# Patient Record
Sex: Male | Born: 1955 | Race: White | Hispanic: No | Marital: Married | Smoking: Current some day smoker
Health system: Southern US, Community
[De-identification: ages and names within clinical notes are randomized; demographics above are authoritative.]

## PROBLEM LIST (undated history)

## (undated) DIAGNOSIS — F329 Major depressive disorder, single episode, unspecified: Secondary | ICD-10-CM

## (undated) DIAGNOSIS — E785 Hyperlipidemia, unspecified: Secondary | ICD-10-CM

## (undated) DIAGNOSIS — I10 Essential (primary) hypertension: Secondary | ICD-10-CM

## (undated) DIAGNOSIS — F32A Depression, unspecified: Secondary | ICD-10-CM

## (undated) DIAGNOSIS — G252 Other specified forms of tremor: Secondary | ICD-10-CM

## (undated) HISTORY — DX: Hyperlipidemia, unspecified: E78.5

## (undated) HISTORY — DX: Depression, unspecified: F32.A

## (undated) HISTORY — DX: Essential (primary) hypertension: I10

## (undated) HISTORY — DX: Other specified forms of tremor: G25.2

---
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
## 2005-10-11 ENCOUNTER — Emergency Department (HOSPITAL_COMMUNITY): Admission: EM | Admit: 2005-10-11 | Discharge: 2005-10-11 | Payer: Self-pay | Admitting: Emergency Medicine

## 2005-12-16 ENCOUNTER — Emergency Department (HOSPITAL_COMMUNITY): Admission: EM | Admit: 2005-12-16 | Discharge: 2005-12-16 | Payer: Self-pay | Admitting: Emergency Medicine

## 2008-06-21 ENCOUNTER — Emergency Department (HOSPITAL_COMMUNITY): Admission: EM | Admit: 2008-06-21 | Discharge: 2008-06-22 | Payer: Self-pay | Admitting: Emergency Medicine

## 2009-06-10 HISTORY — PX: SHOULDER SURGERY: SHX246

## 2010-04-19 LAB — POCT I-STAT, CHEM 8
Glucose, Bld: 126 mg/dL — ABNORMAL HIGH (ref 70–99)
HCT: 45 % (ref 39.0–52.0)
Hemoglobin: 15.3 g/dL (ref 13.0–17.0)
Potassium: 4.3 mEq/L (ref 3.5–5.1)
Sodium: 141 mEq/L (ref 135–145)

## 2010-04-19 LAB — URINALYSIS, ROUTINE W REFLEX MICROSCOPIC
Bilirubin Urine: NEGATIVE
Nitrite: NEGATIVE
Specific Gravity, Urine: 1.019 (ref 1.005–1.030)
Urobilinogen, UA: 0.2 mg/dL (ref 0.0–1.0)

## 2010-04-19 LAB — CBC
MCHC: 35.5 g/dL (ref 30.0–36.0)
RDW: 12.7 % (ref 11.5–15.5)

## 2015-02-26 DIAGNOSIS — M5410 Radiculopathy, site unspecified: Secondary | ICD-10-CM | POA: Insufficient documentation

## 2015-02-26 DIAGNOSIS — M5412 Radiculopathy, cervical region: Secondary | ICD-10-CM | POA: Insufficient documentation

## 2015-02-26 DIAGNOSIS — R251 Tremor, unspecified: Secondary | ICD-10-CM | POA: Insufficient documentation

## 2015-02-26 DIAGNOSIS — E785 Hyperlipidemia, unspecified: Secondary | ICD-10-CM | POA: Insufficient documentation

## 2015-02-26 DIAGNOSIS — R7301 Impaired fasting glucose: Secondary | ICD-10-CM | POA: Insufficient documentation

## 2019-01-30 DIAGNOSIS — F321 Major depressive disorder, single episode, moderate: Secondary | ICD-10-CM | POA: Insufficient documentation

## 2019-05-13 ENCOUNTER — Ambulatory Visit: Payer: 59 | Admitting: Family Medicine

## 2019-05-13 ENCOUNTER — Other Ambulatory Visit: Payer: Self-pay

## 2019-05-13 ENCOUNTER — Encounter: Payer: Self-pay | Admitting: Family Medicine

## 2019-05-13 VITALS — BP 156/90 | HR 67 | Temp 98.2°F | Ht 69.0 in | Wt 202.2 lb

## 2019-05-13 DIAGNOSIS — Z8639 Personal history of other endocrine, nutritional and metabolic disease: Secondary | ICD-10-CM

## 2019-05-13 DIAGNOSIS — Z720 Tobacco use: Secondary | ICD-10-CM | POA: Diagnosis not present

## 2019-05-13 DIAGNOSIS — Z23 Encounter for immunization: Secondary | ICD-10-CM

## 2019-05-13 DIAGNOSIS — F418 Other specified anxiety disorders: Secondary | ICD-10-CM | POA: Diagnosis not present

## 2019-05-13 DIAGNOSIS — Z1211 Encounter for screening for malignant neoplasm of colon: Secondary | ICD-10-CM | POA: Insufficient documentation

## 2019-05-13 DIAGNOSIS — Z0000 Encounter for general adult medical examination without abnormal findings: Secondary | ICD-10-CM

## 2019-05-13 DIAGNOSIS — R0989 Other specified symptoms and signs involving the circulatory and respiratory systems: Secondary | ICD-10-CM

## 2019-05-13 DIAGNOSIS — R030 Elevated blood-pressure reading, without diagnosis of hypertension: Secondary | ICD-10-CM | POA: Diagnosis not present

## 2019-05-13 MED ORDER — ESCITALOPRAM OXALATE 20 MG PO TABS: 20.0000 mg | ORAL_TABLET | Freq: Every day | ORAL | 0 refills | Status: DC

## 2019-05-13 NOTE — Patient Instructions (Signed)
Coping with Quitting Smoking  Quitting smoking is a physical and mental challenge. You will face cravings, withdrawal symptoms, and temptation. Before quitting, work with your health care provider to make a plan that can help you cope. Preparation can help you quit and keep you from giving in. How can I cope with cravings? Cravings usually last for 5-10 minutes. If you get through it, the craving will pass. Consider taking the following actions to help you cope with cravings:  Keep your mouth busy: ? Chew sugar-free gum. ? Suck on hard candies or a straw. ? Brush your teeth.  Keep your hands and body busy: ? Immediately change to a different activity when you feel a craving. ? Squeeze or play with a ball. ? Do an activity or a hobby, like making bead jewelry, practicing needlepoint, or working with wood. ? Mix up your normal routine. ? Take a short exercise break. Go for a quick walk or run up and down stairs. ? Spend time in public places where smoking is not allowed.  Focus on doing something kind or helpful for someone else.  Call a friend or family member to talk during a craving.  Join a support group.  Call a quit line, such as 1-800-QUIT-NOW.  Talk with your health care provider about medicines that might help you cope with cravings and make quitting easier for you. How can I deal with withdrawal symptoms? Your body may experience negative effects as it tries to get used to not having nicotine in the system. These effects are called withdrawal symptoms. They may include:  Feeling hungrier than normal.  Trouble concentrating.  Irritability.  Trouble sleeping.  Feeling depressed.  Restlessness and agitation.  Craving a cigarette. To manage withdrawal symptoms:  Avoid places, people, and activities that trigger your cravings.  Remember why you want to quit.  Get plenty of sleep.  Avoid coffee and other caffeinated drinks. These may worsen some of your  symptoms. How can I handle social situations? Social situations can be difficult when you are quitting smoking, especially in the first few weeks. To manage this, you can:  Avoid parties, bars, and other social situations where people might be smoking.  Avoid alcohol.  Leave right away if you have the urge to smoke.  Explain to your family and friends that you are quitting smoking. Ask for understanding and support.  Plan activities with friends or family where smoking is not an option. What are some ways I can cope with stress? Wanting to smoke may cause stress, and stress can make you want to smoke. Find ways to manage your stress. Relaxation techniques can help. For example:  Breathe slowly and deeply, in through your nose and out through your mouth.  Listen to soothing, relaxing music.  Talk with a family member or friend about your stress.  Light a candle.  Soak in a bath or take a shower.  Think about a peaceful place. What are some ways I can prevent weight gain? Be aware that many people gain weight after they quit smoking. However, not everyone does. To keep from gaining weight, have a plan in place before you quit and stick to the plan after you quit. Your plan should include:  Having healthy snacks. When you have a craving, it may help to: ? Eat plain popcorn, crunchy carrots, celery, or other cut vegetables. ? Chew sugar-free gum.  Changing how you eat: ? Eat small portion sizes at meals. ? Eat 4-6 small meals   throughout the day instead of 1-2 large meals a day. ? Be mindful when you eat. Do not watch television or do other things that might distract you as you eat.  Exercising regularly: ? Make time to exercise each day. If you do not have time for a long workout, do short bouts of exercise for 5-10 minutes several times a day. ? Do some form of strengthening exercise, like weight lifting, and some form of aerobic exercise, like running or swimming.  Drinking  plenty of water or other low-calorie or no-calorie drinks. Drink 6-8 glasses of water daily, or as much as instructed by your health care provider. Summary  Quitting smoking is a physical and mental challenge. You will face cravings, withdrawal symptoms, and temptation to smoke again. Preparation can help you as you go through these challenges.  You can cope with cravings by keeping your mouth busy (such as by chewing gum), keeping your body and hands busy, and making calls to family, friends, or a helpline for people who want to quit smoking.  You can cope with withdrawal symptoms by avoiding places where people smoke, avoiding drinks with caffeine, and getting plenty of rest.  Ask your health care provider about the different ways to prevent weight gain, avoid stress, and handle social situations. This information is not intended to replace advice given to you by your health care provider. Make sure you discuss any questions you have with your health care provider. Document Revised: 12/09/2016 Document Reviewed: 12/25/2015 Elsevier Patient Education  2020 Elsevier Inc.  

## 2019-05-13 NOTE — Progress Notes (Signed)
New Patient Office Visit  Subjective:  Patient ID: Wayne Wheeler, male    DOB: [birth year]  Age: 64 y.o. MRN: [id number]  CC:  Chief Complaint  Patient presents with  . Establish Care    New patient, CPE no concerns.     HPI Wayne Wheeler presents for establishment of care and follow-up of some problems that he has been having.  Started on Lexapro 10 mg daily back in January.  He feels some better with it but feels as though he should be doing better.  He is having no issues taking the medicine.  Still dealing with some depression.  He retired back in the fall.  Has not been staying particularly busy with the cold weather.  Lives at home with his wife.  Tells that his legs get tired whenever he is walks for any length of time.  Smokes 1 pack of cigarettes daily.  History of borderline diabetes in the past and had taken Metformin.  Past Medical History:  Diagnosis Date  . Depression     Past Surgical History:  Procedure Laterality Date  . SHOULDER SURGERY Right 06/10/2009    Family History  Problem Relation Age of Onset  . Depression Mother     Social History   Socioeconomic History  . Marital status: Unknown    Spouse name: Not on file  . Number of children: Not on file  . Years of education: Not on file  . Highest education level: Not on file  Occupational History  . Not on file  Tobacco Use  . Smoking status: Current Some Day Smoker    Packs/day: 1.00  . Smokeless tobacco: Never Used  Substance and Sexual Activity  . Alcohol use: Yes    Alcohol/week: 1.0 standard drinks    Types: 1 Cans of beer per week    Comment: social  . Drug use: Never  . Sexual activity: Not on file  Other Topics Concern  . Not on file  Social History Narrative  . Not on file   Social Determinants of Health   Financial Resource Strain:   . Difficulty of Paying Living Expenses:   Food Insecurity:   . Worried About Charity fundraiser in the Last Year:   . Arboriculturist in the Last  Year:   Transportation Needs:   . Film/video editor (Medical):   Marland Kitchen Lack of Transportation (Non-Medical):   Physical Activity:   . Days of Exercise per Week:   . Minutes of Exercise per Session:   Stress:   . Feeling of Stress :   Social Connections:   . Frequency of Communication with Friends and Family:   . Frequency of Social Gatherings with Friends and Family:   . Attends Religious Services:   . Active Member of Clubs or Organizations:   . Attends Archivist Meetings:   Marland Kitchen Marital Status:   Intimate Partner Violence:   . Fear of Current or Ex-Partner:   . Emotionally Abused:   Marland Kitchen Physically Abused:   . Sexually Abused:     ROS Review of Systems  Constitutional: Negative.   HENT: Negative.   Eyes: Negative for photophobia and visual disturbance.  Respiratory: Negative for chest tightness and shortness of breath.   Cardiovascular: Negative for chest pain.  Gastrointestinal: Negative.   Endocrine: Negative for polyphagia and polyuria.  Genitourinary: Negative.   Musculoskeletal: Positive for gait problem and myalgias.  Skin: Negative for pallor and rash.  Allergic/Immunologic: Negative  for immunocompromised state.  Neurological: Negative for speech difficulty and light-headedness.  Hematological: Does not bruise/bleed easily.   Depression screen [facility] 2/9 05/13/2019 05/13/2019  Decreased Interest 2 2  Down, Depressed, Hopeless 2 0  PHQ - 2 Score 4 2  Altered sleeping 1 -  Tired, decreased energy 3 -  Change in appetite 1 -  Feeling bad or failure about yourself  1 -  Trouble concentrating 1 -  Moving slowly or fidgety/restless 1 -  Suicidal thoughts 0 -  PHQ-9 Score 12 -  Difficult doing work/chores Very difficult -    Objective:   Today's Vitals: BP (!) 156/90   Pulse 67   Temp 98.2 F (36.8 C) (Tympanic)   Ht 5\' 9"  (1.753 m)   Wt 202 lb 3.2 oz (91.7 kg)   SpO2 96%   BMI 29.86 kg/m   Physical Exam Constitutional:      General: He is not in  acute distress.    Appearance: Normal appearance. He is not ill-appearing or toxic-appearing.  HENT:     Head: Normocephalic and atraumatic.     Right Ear: External ear normal.     Left Ear: External ear normal.  Eyes:     General: No scleral icterus.       Right eye: No discharge.        Left eye: No discharge.     Conjunctiva/sclera: Conjunctivae normal.  Cardiovascular:     Rate and Rhythm: Normal rate and regular rhythm.     Pulses:          Dorsalis pedis pulses are 0 on the right side and 2+ on the left side.       Posterior tibial pulses are 0 on the right side and 0 on the left side.  Pulmonary:     Effort: Pulmonary effort is normal.     Breath sounds: Decreased breath sounds present.  Abdominal:     General: Bowel sounds are normal.  Musculoskeletal:     Cervical back: No rigidity or tenderness.  Lymphadenopathy:     Cervical: No cervical adenopathy.  Skin:    General: Skin is warm and dry.  Neurological:     Mental Status: He is alert and oriented to person, place, and time.  Psychiatric:        Mood and Affect: Mood normal.        Behavior: Behavior normal.    Diabetic Foot Exam - Simple   Simple Foot Form Visual Inspection No deformities, no ulcerations, no other skin breakdown bilaterally: Yes Sensation Testing Intact to touch and monofilament testing bilaterally: Yes Pulse Check See comments: Yes Comments Diminished with delayed capillary refill.      Assessment & Plan:   Problem List Items Addressed This Visit      Other   Tobacco use   Decreased pedal pulses   Relevant Orders   VAS ABI WITH/WO TBI   LDL cholesterol, direct   Lipid panel   Need for Tdap vaccination - Primary   Depression with anxiety   Relevant Medications   escitalopram (LEXAPRO) 20 MG tablet   Elevated BP without diagnosis of hypertension   Relevant Orders   Comprehensive metabolic panel   Urinalysis, Routine w reflex microscopic   CBC   History of diabetes  mellitus, type II   Relevant Orders   Hemoglobin A1c   Healthcare maintenance   Relevant Orders   Comprehensive metabolic panel   LDL cholesterol, direct   PSA  Outpatient Encounter Medications as of 05/13/2019  Medication Sig  . aspirin 81 MG EC tablet Take by mouth.  Marland Kitchen ibuprofen (ADVIL) 100 MG/5ML suspension Take 200 mg by mouth every 4 (four) hours as needed.  . [DISCONTINUED] escitalopram (LEXAPRO) 10 MG tablet Take by mouth.  . escitalopram (LEXAPRO) 20 MG tablet Take 1 tablet (20 mg total) by mouth daily.   No facility-administered encounter medications on file as of 05/13/2019.    Follow-up: Return in about 1 month (around 06/13/2019).   Increase Lexapro to 20 mg daily.  Given information on coping with quitting smoking and advised to do so.  Have increased the Lexapro to 20 mg daily.  We will send for ABIs.  Follow-up in one 1 month.  Will need physical exam with labs in the near future. Labs have been ordered for future.   Mliss Sax, MD

## 2019-05-23 ENCOUNTER — Other Ambulatory Visit: Payer: Self-pay

## 2019-05-23 ENCOUNTER — Ambulatory Visit (HOSPITAL_BASED_OUTPATIENT_CLINIC_OR_DEPARTMENT_OTHER)
Admission: RE | Admit: 2019-05-23 | Discharge: 2019-05-23 | Disposition: A | Discharge status: Home / Self Care | Payer: 59 | Source: Ambulatory Visit | Attending: Family Medicine | Admitting: Family Medicine

## 2019-05-23 DIAGNOSIS — R0989 Other specified symptoms and signs involving the circulatory and respiratory systems: Secondary | ICD-10-CM | POA: Diagnosis not present

## 2019-05-23 NOTE — Progress Notes (Signed)
  Echocardiogram 2D Echocardiogram has been performed.  Wayne Wheeler 05/23/2019, 2:56 PM

## 2019-05-27 ENCOUNTER — Telehealth: Payer: Self-pay | Admitting: Family Medicine

## 2019-05-27 NOTE — Telephone Encounter (Signed)
Patient is returning the call. CB is [phone].

## 2019-05-29 NOTE — Telephone Encounter (Signed)
Spoke with patient.

## 2019-06-12 ENCOUNTER — Other Ambulatory Visit: Payer: Self-pay

## 2019-06-13 ENCOUNTER — Encounter: Payer: Self-pay | Admitting: Family Medicine

## 2019-06-13 ENCOUNTER — Ambulatory Visit: Payer: 59 | Admitting: Family Medicine

## 2019-06-13 VITALS — BP 154/78 | HR 65 | Temp 98.4°F | Ht 69.0 in | Wt 202.4 lb

## 2019-06-13 DIAGNOSIS — F418 Other specified anxiety disorders: Secondary | ICD-10-CM | POA: Diagnosis not present

## 2019-06-13 DIAGNOSIS — Z720 Tobacco use: Secondary | ICD-10-CM | POA: Diagnosis not present

## 2019-06-13 DIAGNOSIS — R030 Elevated blood-pressure reading, without diagnosis of hypertension: Secondary | ICD-10-CM

## 2019-06-13 MED ORDER — ESCITALOPRAM OXALATE 20 MG PO TABS: 20.0000 mg | ORAL_TABLET | Freq: Every day | ORAL | 0 refills | Status: DC

## 2019-06-13 NOTE — Patient Instructions (Signed)
Coping with Quitting Smoking  Quitting smoking is a physical and mental challenge. You will face cravings, withdrawal symptoms, and temptation. Before quitting, work with your health care provider to make a plan that can help you cope. Preparation can help you quit and keep you from giving in. How can I cope with cravings? Cravings usually last for 5-10 minutes. If you get through it, the craving will pass. Consider taking the following actions to help you cope with cravings:  Keep your mouth busy: ? Chew sugar-free gum. ? Suck on hard candies or a straw. ? Brush your teeth.  Keep your hands and body busy: ? Immediately change to a different activity when you feel a craving. ? Squeeze or play with a ball. ? Do an activity or a hobby, like making bead jewelry, practicing needlepoint, or working with wood. ? Mix up your normal routine. ? Take a short exercise break. Go for a quick walk or run up and down stairs. ? Spend time in public places where smoking is not allowed.  Focus on doing something kind or helpful for someone else.  Call a friend or family member to talk during a craving.  Join a support group.  Call a quit line, such as 1-800-QUIT-NOW.  Talk with your health care provider about medicines that might help you cope with cravings and make quitting easier for you. How can I deal with withdrawal symptoms? Your body may experience negative effects as it tries to get used to not having nicotine in the system. These effects are called withdrawal symptoms. They may include:  Feeling hungrier than normal.  Trouble concentrating.  Irritability.  Trouble sleeping.  Feeling depressed.  Restlessness and agitation.  Craving a cigarette. To manage withdrawal symptoms:  Avoid places, people, and activities that trigger your cravings.  Remember why you want to quit.  Get plenty of sleep.  Avoid coffee and other caffeinated drinks. These may worsen some of your  symptoms. How can I handle social situations? Social situations can be difficult when you are quitting smoking, especially in the first few weeks. To manage this, you can:  Avoid parties, bars, and other social situations where people might be smoking.  Avoid alcohol.  Leave right away if you have the urge to smoke.  Explain to your family and friends that you are quitting smoking. Ask for understanding and support.  Plan activities with friends or family where smoking is not an option. What are some ways I can cope with stress? Wanting to smoke may cause stress, and stress can make you want to smoke. Find ways to manage your stress. Relaxation techniques can help. For example:  Breathe slowly and deeply, in through your nose and out through your mouth.  Listen to soothing, relaxing music.  Talk with a family member or friend about your stress.  Light a candle.  Soak in a bath or take a shower.  Think about a peaceful place. What are some ways I can prevent weight gain? Be aware that many people gain weight after they quit smoking. However, not everyone does. To keep from gaining weight, have a plan in place before you quit and stick to the plan after you quit. Your plan should include:  Having healthy snacks. When you have a craving, it may help to: ? Eat plain popcorn, crunchy carrots, celery, or other cut vegetables. ? Chew sugar-free gum.  Changing how you eat: ? Eat small portion sizes at meals. ? Eat 4-6 small meals   throughout the day instead of 1-2 large meals a day. ? Be mindful when you eat. Do not watch television or do other things that might distract you as you eat.  Exercising regularly: ? Make time to exercise each day. If you do not have time for a long workout, do short bouts of exercise for 5-10 minutes several times a day. ? Do some form of strengthening exercise, like weight lifting, and some form of aerobic exercise, like running or swimming.  Drinking  plenty of water or other low-calorie or no-calorie drinks. Drink 6-8 glasses of water daily, or as much as instructed by your health care provider. Summary  Quitting smoking is a physical and mental challenge. You will face cravings, withdrawal symptoms, and temptation to smoke again. Preparation can help you as you go through these challenges.  You can cope with cravings by keeping your mouth busy (such as by chewing gum), keeping your body and hands busy, and making calls to family, friends, or a helpline for people who want to quit smoking.  You can cope with withdrawal symptoms by avoiding places where people smoke, avoiding drinks with caffeine, and getting plenty of rest.  Ask your health care provider about the different ways to prevent weight gain, avoid stress, and handle social situations. This information is not intended to replace advice given to you by your health care provider. Make sure you discuss any questions you have with your health care provider. Document Revised: 12/09/2016 Document Reviewed: 12/25/2015 Elsevier Patient Education  2020 Elsevier Inc.  Preventing Hypertension Hypertension, commonly called high blood pressure, is when the force of blood pumping through the arteries is too strong. Arteries are blood vessels that carry blood from the heart throughout the body. Over time, hypertension can damage the arteries and decrease blood flow to important parts of the body, including the brain, heart, and kidneys. Often, hypertension does not cause symptoms until blood pressure is very high. For this reason, it is important to have your blood pressure checked on a regular basis. Hypertension can often be prevented with diet and lifestyle changes. If you already have hypertension, you can control it with diet and lifestyle changes, as well as medicine. What nutrition changes can be made? Maintain a healthy diet. This includes:  Eating less salt (sodium). Ask your health care  provider how much sodium is safe for you to have. The general recommendation is to consume less than 1 tsp (2,300 mg) of sodium a day. ? Do not add salt to your food. ? Choose low-sodium options when grocery shopping and eating out.  Limiting fats in your diet. You can do this by eating low-fat or fat-free dairy products and by eating less red meat.  Eating more fruits, vegetables, and whole grains. Make a goal to eat: ? 1-2 cups of fresh fruits and vegetables each day. ? 3-4 servings of whole grains each day.  Avoiding foods and beverages that have added sugars.  Eating fish that contain healthy fats (omega-3 fatty acids), such as mackerel or salmon. If you need help putting together a healthy eating plan, try the DASH diet. This diet is high in fruits, vegetables, and whole grains. It is low in sodium, red meat, and added sugars. DASH stands for Dietary Approaches to Stop Hypertension. What lifestyle changes can be made?   Lose weight if you are overweight. Losing just 3?5% of your body weight can help prevent or control hypertension. ? For example, if your present weight is 200   lb (91 kg), a loss of 3-5% of your weight means losing 6-10 lb (2.7-4.5 kg). ? Ask your health care provider to help you with a diet and exercise plan to safely lose weight.  Get enough exercise. Do at least 150 minutes of moderate-intensity exercise each week. ? You could do this in short exercise sessions several times a day, or you could do longer exercise sessions a few times a week. For example, you could take a brisk 10-minute walk or bike ride, 3 times a day, for 5 days a week.  Find ways to reduce stress, such as exercising, meditating, listening to music, or taking a yoga class. If you need help reducing stress, ask your health care provider.  Do not smoke. This includes e-cigarettes. Chemicals in tobacco and nicotine products raise your blood pressure each time you smoke. If you need help quitting, ask  your health care provider.  Avoid alcohol. If you drink alcohol, limit alcohol intake to no more than 1 drink a day for nonpregnant women and 2 drinks a day for men. One drink equals 12 oz of beer, 5 oz of wine, or 1 oz of hard liquor. Why are these changes important? Diet and lifestyle changes can help you prevent hypertension, and they may make you feel better overall and improve your quality of life. If you have hypertension, making these changes will help you control it and help prevent major complications, such as:  Hardening and narrowing of arteries that supply blood to: ? Your heart. This can cause a heart attack. ? Your brain. This can cause a stroke. ? Your kidneys. This can cause kidney failure.  Stress on your heart muscle, which can cause heart failure. What can I do to lower my risk?  Work with your health care provider to make a hypertension prevention plan that works for you. Follow your plan and keep all follow-up visits as told by your health care provider.  Learn how to check your blood pressure at home. Make sure that you know your personal target blood pressure, as told by your health care provider. How is this treated? In addition to diet and lifestyle changes, your health care provider may recommend medicines to help lower your blood pressure. You may need to try a few different medicines to find what works best for you. You also may need to take more than one medicine. Take over-the-counter and prescription medicines only as told by your health care provider. Where to find support Your health care provider can help you prevent hypertension and help you keep your blood pressure at a healthy level. Your local hospital or your community may also provide support services and prevention programs. The American Heart Association offers an online support network at: http://supportnetwork.heart.org/high-blood-pressure Where to find more information Learn more about hypertension  from:  National Heart, Lung, and Blood Institute: www.nhlbi.nih.gov/health/health-topics/topics/hbp  Centers for Disease Control and Prevention: www.cdc.gov/bloodpressure  American Academy of Family Physicians: http://familydoctor.org/familydoctor/en/diseases-conditions/high-blood-pressure.printerview.all.html Learn more about the DASH diet from:  National Heart, Lung, and Blood Institute: www.nhlbi.nih.gov/health/health-topics/topics/dash Contact a health care provider if:  You think you are having a reaction to medicines you have taken.  You have recurrent headaches or feel dizzy.  You have swelling in your ankles.  You have trouble with your vision. Summary  Hypertension often does not cause any symptoms until blood pressure is very high. It is important to get your blood pressure checked regularly.  Diet and lifestyle changes are the most important steps in preventing   hypertension.  By keeping your blood pressure in a healthy range, you can prevent complications like heart attack, heart failure, stroke, and kidney failure.  Work with your health care provider to make a hypertension prevention plan that works for you. This information is not intended to replace advice given to you by your health care provider. Make sure you discuss any questions you have with your health care provider. Document Revised: 04/20/2018 Document Reviewed: 09/07/2015 Elsevier Patient Education  2020 Elsevier Inc.  

## 2019-06-13 NOTE — Progress Notes (Signed)
Established Patient Office Visit  Subjective:  Patient ID: Wayne Wheeler, male    DOB: 1955-12-25  Age: 64 y.o. MRN: [id number]  CC:  Chief Complaint  Patient presents with  . Follow-up    1 month follow up on meds and depression/anxiety.     HPI Wayne Wheeler presents for follow-up of depression with anxiety status post increasing Lexapro to 20 mg.  Fortunately he has noted on marketed improvement.  Feeling much better.  Less anxiety.  Feels more like getting out of the house and going out about.  On edge a little bit because his sister may have an aggressive tumor in her chest.  Has blood pressure cuff at home.  Past Medical History:  Diagnosis Date  . Depression     Past Surgical History:  Procedure Laterality Date  . SHOULDER SURGERY Right 06/10/2009    Family History  Problem Relation Age of Onset  . Depression Mother     Social History   Socioeconomic History  . Marital status: Unknown    Spouse name: Not on file  . Number of children: Not on file  . Years of education: Not on file  . Highest education level: Not on file  Occupational History  . Not on file  Tobacco Use  . Smoking status: Current Some Day Smoker    Packs/day: 1.00  . Smokeless tobacco: Never Used  Substance and Sexual Activity  . Alcohol use: Yes    Alcohol/week: 1.0 standard drinks    Types: 1 Cans of beer per week    Comment: social  . Drug use: Never  . Sexual activity: Not on file  Other Topics Concern  . Not on file  Social History Narrative  . Not on file   Social Determinants of Health   Financial Resource Strain:   . Difficulty of Paying Living Expenses:   Food Insecurity:   . Worried About Programme researcher, broadcasting/film/video in the Last Year:   . Barista in the Last Year:   Transportation Needs:   . Freight forwarder (Medical):   Marland Kitchen Lack of Transportation (Non-Medical):   Physical Activity:   . Days of Exercise per Week:   . Minutes of Exercise per Session:   Stress:   .  Feeling of Stress :   Social Connections:   . Frequency of Communication with Friends and Family:   . Frequency of Social Gatherings with Friends and Family:   . Attends Religious Services:   . Active Member of Clubs or Organizations:   . Attends Banker Meetings:   Marland Kitchen Marital Status:   Intimate Partner Violence:   . Fear of Current or Ex-Partner:   . Emotionally Abused:   Marland Kitchen Physically Abused:   . Sexually Abused:     Outpatient Medications Prior to Visit  Medication Sig Dispense Refill  . aspirin 81 MG EC tablet Take by mouth.    Marland Kitchen ibuprofen (ADVIL) 100 MG/5ML suspension Take 200 mg by mouth every 4 (four) hours as needed.    Marland Kitchen escitalopram (LEXAPRO) 20 MG tablet Take 1 tablet (20 mg total) by mouth daily. 90 tablet 0   No facility-administered medications prior to visit.    Not on File  ROS Review of Systems  HENT: Negative.   Respiratory: Negative.   Cardiovascular: Negative.   Gastrointestinal: Negative.   Neurological: Negative for light-headedness and headaches.   Depression screen [facility] 2/9 06/13/2019 05/13/2019 05/13/2019  Decreased Interest 1 2 2  Down, Depressed, Hopeless 1 2 0  PHQ - 2 Score 2 4 2   Altered sleeping 1 1 -  Tired, decreased energy 1 3 -  Change in appetite 1 1 -  Feeling bad or failure about yourself  1 1 -  Trouble concentrating 1 1 -  Moving slowly or fidgety/restless 1 1 -  Suicidal thoughts 0 0 -  PHQ-9 Score 8 12 -  Difficult doing work/chores Somewhat difficult Very difficult -      Objective:    Physical Exam  Constitutional: He is oriented to person, place, and time. He appears well-developed and well-nourished. No distress.  HENT:  Head: Normocephalic and atraumatic.  Right Ear: External ear normal.  Left Ear: External ear normal.  Eyes: Conjunctivae are normal. Right eye exhibits no discharge. Left eye exhibits no discharge. No scleral icterus.  Neck: No JVD present. No tracheal deviation present.  Pulmonary/Chest:  Effort normal. No stridor.  Neurological: He is alert and oriented to person, place, and time.  Skin: He is not diaphoretic.  Psychiatric: He has a normal mood and affect. His behavior is normal.    BP (!) 154/78   Pulse 65   Temp 98.4 F (36.9 C) (Tympanic)   Ht 5\' 9"  (1.753 m)   Wt 202 lb 6.4 oz (91.8 kg)   SpO2 96%   BMI 29.89 kg/m  Wt Readings from Last 3 Encounters:  06/13/19 202 lb 6.4 oz (91.8 kg)  05/13/19 202 lb 3.2 oz (91.7 kg)     Health Maintenance Due  Topic Date Due  . Hepatitis C Screening  Never done  . COVID-19 Vaccine (1) Never done  . HIV Screening  Never done  . TETANUS/TDAP  Never done    There are no preventive care reminders to display for this patient.  No results found for: TSH Lab Results  Component Value Date   WBC 9.6 06/21/2008   HGB 15.3 06/22/2008   HCT 45.0 06/22/2008   MCV 93.7 06/21/2008   PLT 169 06/21/2008   Lab Results  Component Value Date   NA 141 06/22/2008   K 4.3 06/22/2008   GLUCOSE 126 (H) 06/22/2008   BUN 11 06/22/2008   CREATININE 0.8 06/22/2008   No results found for: CHOL No results found for: HDL No results found for: LDLCALC No results found for: TRIG No results found for: CHOLHDL No results found for: HGBA1C    Assessment & Plan:   Problem List Items Addressed This Visit      Other   Tobacco use   Depression with anxiety - Primary   Relevant Medications   escitalopram (LEXAPRO) 20 MG tablet   Elevated BP without diagnosis of hypertension      Meds ordered this encounter  Medications  . escitalopram (LEXAPRO) 20 MG tablet    Sig: Take 1 tablet (20 mg total) by mouth daily.    Dispense:  90 tablet    Refill:  0    Follow-up: Return in about 2 months (around 08/13/2019), or exercise, check and record blood pressure, try to stop smoking..  Will return in a few months for his physical and ordered blood work.  Given information on steps to quit smoking and preventing hypertension.  He knows to  avoid salt.  Will check and record his blood pressures.  Libby Maw, MD

## 2019-09-24 ENCOUNTER — Encounter: Payer: Self-pay | Admitting: Family Medicine

## 2019-09-24 ENCOUNTER — Other Ambulatory Visit: Payer: Self-pay

## 2019-09-25 ENCOUNTER — Ambulatory Visit (INDEPENDENT_AMBULATORY_CARE_PROVIDER_SITE_OTHER): Payer: 59 | Admitting: Family Medicine

## 2019-09-25 ENCOUNTER — Encounter: Payer: Self-pay | Admitting: Family Medicine

## 2019-09-25 ENCOUNTER — Encounter: Payer: 59 | Admitting: Family Medicine

## 2019-09-25 VITALS — BP 156/78 | HR 62 | Temp 98.5°F | Ht 69.0 in | Wt 203.2 lb

## 2019-09-25 DIAGNOSIS — Z0000 Encounter for general adult medical examination without abnormal findings: Secondary | ICD-10-CM

## 2019-09-25 DIAGNOSIS — Z23 Encounter for immunization: Secondary | ICD-10-CM

## 2019-09-25 DIAGNOSIS — R0989 Other specified symptoms and signs involving the circulatory and respiratory systems: Secondary | ICD-10-CM

## 2019-09-25 DIAGNOSIS — Z720 Tobacco use: Secondary | ICD-10-CM

## 2019-09-25 DIAGNOSIS — Z125 Encounter for screening for malignant neoplasm of prostate: Secondary | ICD-10-CM | POA: Diagnosis not present

## 2019-09-25 DIAGNOSIS — R030 Elevated blood-pressure reading, without diagnosis of hypertension: Secondary | ICD-10-CM | POA: Diagnosis not present

## 2019-09-25 DIAGNOSIS — F418 Other specified anxiety disorders: Secondary | ICD-10-CM

## 2019-09-25 DIAGNOSIS — I10 Essential (primary) hypertension: Secondary | ICD-10-CM

## 2019-09-25 DIAGNOSIS — Z8639 Personal history of other endocrine, nutritional and metabolic disease: Secondary | ICD-10-CM | POA: Diagnosis not present

## 2019-09-25 DIAGNOSIS — E7800 Pure hypercholesterolemia, unspecified: Secondary | ICD-10-CM

## 2019-09-25 LAB — CBC
HCT: 46.8 % (ref 39.0–52.0)
Hemoglobin: 15.8 g/dL (ref 13.0–17.0)
MCHC: 33.9 g/dL (ref 30.0–36.0)
MCV: 98.2 fl (ref 78.0–100.0)
Platelets: 147 10*3/uL — ABNORMAL LOW (ref 150.0–400.0)
RBC: 4.76 Mil/uL (ref 4.22–5.81)
RDW: 13.7 % (ref 11.5–15.5)
WBC: 7 10*3/uL (ref 4.0–10.5)

## 2019-09-25 LAB — COMPREHENSIVE METABOLIC PANEL
ALT: 23 U/L (ref 0–53)
AST: 16 U/L (ref 0–37)
Albumin: 4.4 g/dL (ref 3.5–5.2)
Alkaline Phosphatase: 68 U/L (ref 39–117)
BUN: 11 mg/dL (ref 6–23)
CO2: 28 mEq/L (ref 19–32)
Calcium: 9.2 mg/dL (ref 8.4–10.5)
Chloride: 104 mEq/L (ref 96–112)
Creatinine, Ser: 0.99 mg/dL (ref 0.40–1.50)
GFR: 76.1 mL/min (ref >60.00)
Glucose, Bld: 123 mg/dL — ABNORMAL HIGH (ref 70–99)
Potassium: 4.1 mEq/L (ref 3.5–5.1)
Sodium: 138 mEq/L (ref 135–145)
Total Bilirubin: 0.4 mg/dL (ref 0.2–1.2)
Total Protein: 7 g/dL (ref 6.0–8.3)

## 2019-09-25 LAB — HEMOGLOBIN A1C: Hgb A1c MFr Bld: 6.1 % (ref 4.6–6.5)

## 2019-09-25 LAB — LIPID PANEL
Cholesterol: 186 mg/dL (ref 0–200)
HDL: 38.7 mg/dL — ABNORMAL LOW (ref >39.00)
LDL Cholesterol: 114 mg/dL — ABNORMAL HIGH (ref 0–99)
NonHDL: 147.59
Total CHOL/HDL Ratio: 5
Triglycerides: 167 mg/dL — ABNORMAL HIGH (ref 0.0–149.0)
VLDL: 33.4 mg/dL (ref 0.0–40.0)

## 2019-09-25 LAB — PSA: PSA: 0.64 ng/mL (ref 0.10–4.00)

## 2019-09-25 LAB — LDL CHOLESTEROL, DIRECT: Direct LDL: 128 mg/dL

## 2019-09-25 MED ORDER — LISINOPRIL 20 MG PO TABS: 20.0000 mg | ORAL_TABLET | Freq: Every day | ORAL | 0 refills | Status: DC

## 2019-09-25 NOTE — Progress Notes (Addendum)
Established Patient Office Visit  Subjective:  Patient ID: Wayne Wheeler, male    DOB: 05-18-1955  Age: 64 y.o. MRN: [id number]  CC:  Chief Complaint  Patient presents with  . Annual Exam    CPE, no concerns     HPI Wayne Wheeler presents for continues Lexapro for depression and anxiety and it is helping.  He is fasting today for complete physical.  He has taken some steps to quit smoking.  History of borderline diabetes.  Blood pressure has been borderline in the past but it continues to be elevated.  He denies headaches or lightheadedness.  Fortunately ABIs showed that blood flow to his feet is intact.  He has had a colonoscopy in the past.  He was advised to return in 10 years.  Denies any changes in his bowel habits.  Force of urinary stream is decreased but denies pre or post void dribble or nocturia.  It is manageable for him at this point.  Past Medical History:  Diagnosis Date  . Depression     Past Surgical History:  Procedure Laterality Date  . SHOULDER SURGERY Right 06/10/2009    Family History  Problem Relation Age of Onset  . Depression Mother     Social History   Socioeconomic History  . Marital status: Unknown    Spouse name: Not on file  . Number of children: Not on file  . Years of education: Not on file  . Highest education level: Not on file  Occupational History  . Not on file  Tobacco Use  . Smoking status: Current Some Day Smoker    Packs/day: 1.00  . Smokeless tobacco: Never Used  Vaping Use  . Vaping Use: Never used  Substance and Sexual Activity  . Alcohol use: Yes    Alcohol/week: 1.0 standard drink    Types: 1 Cans of beer per week    Comment: social  . Drug use: Never  . Sexual activity: Not on file  Other Topics Concern  . Not on file  Social History Narrative  . Not on file   Social Determinants of Health   Financial Resource Strain:   . Difficulty of Paying Living Expenses: Not on file  Food Insecurity:   . Worried About  Programme researcher, broadcasting/film/video in the Last Year: Not on file  . Ran Out of Food in the Last Year: Not on file  Transportation Needs:   . Lack of Transportation (Medical): Not on file  . Lack of Transportation (Non-Medical): Not on file  Physical Activity:   . Days of Exercise per Week: Not on file  . Minutes of Exercise per Session: Not on file  Stress:   . Feeling of Stress : Not on file  Social Connections:   . Frequency of Communication with Friends and Family: Not on file  . Frequency of Social Gatherings with Friends and Family: Not on file  . Attends Religious Services: Not on file  . Active Member of Clubs or Organizations: Not on file  . Attends Banker Meetings: Not on file  . Marital Status: Not on file  Intimate Partner Violence:   . Fear of Current or Ex-Partner: Not on file  . Emotionally Abused: Not on file  . Physically Abused: Not on file  . Sexually Abused: Not on file    Outpatient Medications Prior to Visit  Medication Sig Dispense Refill  . aspirin 81 MG EC tablet Take by mouth.    . escitalopram (  LEXAPRO) 20 MG tablet Take 1 tablet (20 mg total) by mouth daily. 90 tablet 0  . ibuprofen (ADVIL) 100 MG/5ML suspension Take 200 mg by mouth every 4 (four) hours as needed. (Patient not taking: Reported on 09/25/2019)     No facility-administered medications prior to visit.    Allergies  Allergen Reactions  . Prednisone Other (See Comments)    Dyspepsia    ROS Review of Systems  Constitutional: Negative.   HENT: Negative.   Eyes: Negative for photophobia and visual disturbance.  Respiratory: Negative.   Cardiovascular: Negative.   Gastrointestinal: Negative.   Endocrine: Negative for polyphagia and polyuria.  Genitourinary: Negative.   Musculoskeletal: Negative for gait problem and joint swelling.  Skin: Negative for pallor and rash.  Allergic/Immunologic: Negative for immunocompromised state.  Neurological: Negative for light-headedness and  numbness.  Hematological: Does not bruise/bleed easily.  Psychiatric/Behavioral: Negative.       Objective:    Physical Exam Vitals and nursing note reviewed.  Constitutional:      General: He is not in acute distress.    Appearance: Normal appearance. He is not ill-appearing, toxic-appearing or diaphoretic.  HENT:     Head: Normocephalic and atraumatic.     Right Ear: External ear normal.     Left Ear: External ear normal.  Eyes:     General: No scleral icterus.       Right eye: No discharge.        Left eye: No discharge.     Extraocular Movements: Extraocular movements intact.     Conjunctiva/sclera: Conjunctivae normal.     Pupils: Pupils are equal, round, and reactive to light.  Neck:     Vascular: No carotid bruit.  Cardiovascular:     Rate and Rhythm: Normal rate and regular rhythm.  Pulmonary:     Effort: Pulmonary effort is normal.     Breath sounds: Normal breath sounds.  Abdominal:     General: Abdomen is flat. Bowel sounds are normal. There is no distension.     Palpations: Abdomen is soft. There is no mass.     Tenderness: There is no abdominal tenderness. There is no guarding or rebound.     Hernia: No hernia is present.  Genitourinary:    Prostate: Enlarged. Not tender and no nodules present.     Rectum: Guaiac result negative. External hemorrhoid present. No mass, tenderness, anal fissure or internal hemorrhoid. Normal anal tone.  Musculoskeletal:     Cervical back: No rigidity or tenderness.     Right lower leg: No edema.     Left lower leg: No edema.  Lymphadenopathy:     Cervical: No cervical adenopathy.  Skin:    General: Skin is warm.  Neurological:     General: No focal deficit present.     Mental Status: He is alert and oriented to person, place, and time.  Psychiatric:        Mood and Affect: Mood normal.        Behavior: Behavior normal.     There were no vitals taken for this visit. Wt Readings from Last 3 Encounters:  09/25/19 203  lb 3.2 oz (92.2 kg)  06/13/19 202 lb 6.4 oz (91.8 kg)  05/13/19 202 lb 3.2 oz (91.7 kg)     Health Maintenance Due  Topic Date Due  . Hepatitis C Screening  Never done  . HIV Screening  Never done    There are no preventive care reminders to display for this  patient.  No results found for: TSH Lab Results  Component Value Date   WBC 7.0 09/25/2019   HGB 15.8 09/25/2019   HCT 46.8 09/25/2019   MCV 98.2 09/25/2019   PLT 147.0 (L) 09/25/2019   Lab Results  Component Value Date   NA 138 09/25/2019   K 4.1 09/25/2019   CO2 28 09/25/2019   GLUCOSE 123 (H) 09/25/2019   BUN 11 09/25/2019   CREATININE 0.99 09/25/2019   BILITOT 0.4 09/25/2019   ALKPHOS 68 09/25/2019   AST 16 09/25/2019   ALT 23 09/25/2019   PROT 7.0 09/25/2019   ALBUMIN 4.4 09/25/2019   CALCIUM 9.2 09/25/2019   GFR 76.10 09/25/2019   Lab Results  Component Value Date   CHOL 186 09/25/2019   Lab Results  Component Value Date   HDL 38.70 (L) 09/25/2019   Lab Results  Component Value Date   LDLCALC 114 (H) 09/25/2019   Lab Results  Component Value Date   TRIG 167.0 (H) 09/25/2019   Lab Results  Component Value Date   CHOLHDL 5 09/25/2019   Lab Results  Component Value Date   HGBA1C 6.1 09/25/2019   The 10-year ASCVD risk score Denman George DC Jr., et al., 2013) is: 28.8%   Values used to calculate the score:     Age: 63 years     Sex: Male     Is Non-Hispanic African American: No     Diabetic: No     Tobacco smoker: Yes     Systolic Blood Pressure: 156 mmHg     Is BP treated: Yes     HDL Cholesterol: 38.7 mg/dL     Total Cholesterol: 186 mg/dL   Assessment & Plan:   Problem List Items Addressed This Visit      Cardiovascular and Mediastinum   Essential hypertension   Relevant Medications   lisinopril (ZESTRIL) 20 MG tablet   atorvastatin (LIPITOR) 20 MG tablet   Other Relevant Orders   Urinalysis, Routine w reflex microscopic (Completed)     Other   Tobacco use   Decreased pedal  pulses   Depression with anxiety   Elevated BP without diagnosis of hypertension   History of diabetes mellitus, type II   Relevant Orders   Urinalysis, Routine w reflex microscopic (Completed)   Healthcare maintenance - Primary   Relevant Orders   Urinalysis, Routine w reflex microscopic (Completed)   Need for influenza vaccination   Relevant Orders   Flu Vaccine QUAD 6+ mos PF IM (Fluarix Quad PF) (Completed)   Elevated LDL cholesterol level   Relevant Medications   atorvastatin (LIPITOR) 20 MG tablet      Meds ordered this encounter  Medications  . lisinopril (ZESTRIL) 20 MG tablet    Sig: Take 1 tablet (20 mg total) by mouth daily.    Dispense:  90 tablet    Refill:  0  . atorvastatin (LIPITOR) 20 MG tablet    Sig: Take 1 tablet (20 mg total) by mouth daily.    Dispense:  90 tablet    Refill:  3    Follow-up: Return in about 3 months (around 12/25/2019).   Continue Lexapro for depression and anxiety.  Have added Zestril today at 20 mg for blood pressure.  Encouraged him again to try to stop smoking.  He was given information on stopping this smoking, managing hypertension health maintenance and preventive care.  May be adding meds for diabetes and cholesterol pending results of blood work. Talmadge Coventry  Ethelene Hal, MD

## 2019-09-25 NOTE — Patient Instructions (Signed)
Coping with Quitting Smoking  Quitting smoking is a physical and mental challenge. You will face cravings, withdrawal symptoms, and temptation. Before quitting, work with your health care provider to make a plan that can help you cope. Preparation can help you quit and keep you from giving in. How can I cope with cravings? Cravings usually last for 5-10 minutes. If you get through it, the craving will pass. Consider taking the following actions to help you cope with cravings:  Keep your mouth busy: ? Chew sugar-free gum. ? Suck on hard candies or a straw. ? Brush your teeth.  Keep your hands and body busy: ? Immediately change to a different activity when you feel a craving. ? Squeeze or play with a ball. ? Do an activity or a hobby, like making bead jewelry, practicing needlepoint, or working with wood. ? Mix up your normal routine. ? Take a short exercise break. Go for a quick walk or run up and down stairs. ? Spend time in public places where smoking is not allowed.  Focus on doing something kind or helpful for someone else.  Call a friend or family member to talk during a craving.  Join a support group.  Call a quit line, such as 1-800-QUIT-NOW.  Talk with your health care provider about medicines that might help you cope with cravings and make quitting easier for you. How can I deal with withdrawal symptoms? Your body may experience negative effects as it tries to get used to not having nicotine in the system. These effects are called withdrawal symptoms. They may include:  Feeling hungrier than normal.  Trouble concentrating.  Irritability.  Trouble sleeping.  Feeling depressed.  Restlessness and agitation.  Craving a cigarette. To manage withdrawal symptoms:  Avoid places, people, and activities that trigger your cravings.  Remember why you want to quit.  Get plenty of sleep.  Avoid coffee and other caffeinated drinks. These may worsen some of your  symptoms. How can I handle social situations? Social situations can be difficult when you are quitting smoking, especially in the first few weeks. To manage this, you can:  Avoid parties, bars, and other social situations where people might be smoking.  Avoid alcohol.  Leave right away if you have the urge to smoke.  Explain to your family and friends that you are quitting smoking. Ask for understanding and support.  Plan activities with friends or family where smoking is not an option. What are some ways I can cope with stress? Wanting to smoke may cause stress, and stress can make you want to smoke. Find ways to manage your stress. Relaxation techniques can help. For example:  Breathe slowly and deeply, in through your nose and out through your mouth.  Listen to soothing, relaxing music.  Talk with a family member or friend about your stress.  Light a candle.  Soak in a bath or take a shower.  Think about a peaceful place. What are some ways I can prevent weight gain? Be aware that many people gain weight after they quit smoking. However, not everyone does. To keep from gaining weight, have a plan in place before you quit and stick to the plan after you quit. Your plan should include:  Having healthy snacks. When you have a craving, it may help to: ? Eat plain popcorn, crunchy carrots, celery, or other cut vegetables. ? Chew sugar-free gum.  Changing how you eat: ? Eat small portion sizes at meals. ? Eat 4-6 small meals  throughout the day instead of 1-2 large meals a day. ? Be mindful when you eat. Do not watch television or do other things that might distract you as you eat.  Exercising regularly: ? Make time to exercise each day. If you do not have time for a long workout, do short bouts of exercise for 5-10 minutes several times a day. ? Do some form of strengthening exercise, like weight lifting, and some form of aerobic exercise, like running or swimming.  Drinking  plenty of water or other low-calorie or no-calorie drinks. Drink 6-8 glasses of water daily, or as much as instructed by your health care provider. Summary  Quitting smoking is a physical and mental challenge. You will face cravings, withdrawal symptoms, and temptation to smoke again. Preparation can help you as you go through these challenges.  You can cope with cravings by keeping your mouth busy (such as by chewing gum), keeping your body and hands busy, and making calls to family, friends, or a helpline for people who want to quit smoking.  You can cope with withdrawal symptoms by avoiding places where people smoke, avoiding drinks with caffeine, and getting plenty of rest.  Ask your health care provider about the different ways to prevent weight gain, avoid stress, and handle social situations. This information is not intended to replace advice given to you by your health care provider. Make sure you discuss any questions you have with your health care provider. Document Revised: 12/09/2016 Document Reviewed: 12/25/2015 Elsevier Patient Education  2020 [employer].  Managing Your Hypertension Hypertension is commonly called high blood pressure. This is when the force of your blood pressing against the walls of your arteries is too strong. Arteries are blood vessels that carry blood from your heart throughout your body. Hypertension forces the heart to work harder to pump blood, and may cause the arteries to become narrow or stiff. Having untreated or uncontrolled hypertension can cause heart attack, stroke, kidney disease, and other problems. What are blood pressure readings? A blood pressure reading consists of a higher number over a lower number. Ideally, your blood pressure should be below 120/80. The first ("top") number is called the systolic pressure. It is a measure of the pressure in your arteries as your heart beats. The second ("bottom") number is called the diastolic pressure. It  is a measure of the pressure in your arteries as the heart relaxes. What does my blood pressure reading mean? Blood pressure is classified into four stages. Based on your blood pressure reading, your health care provider may use the following stages to determine what type of treatment you need, if any. Systolic pressure and diastolic pressure are measured in a unit called mm Hg. Normal  Systolic pressure: below 655.  Diastolic pressure: below 80. Elevated  Systolic pressure: 374-827.  Diastolic pressure: below 80. Hypertension stage 1  Systolic pressure: 078-675.  Diastolic pressure: 44-92. Hypertension stage 2  Systolic pressure: 010 or above.  Diastolic pressure: 90 or above. What health risks are associated with hypertension? Managing your hypertension is an important responsibility. Uncontrolled hypertension can lead to:  A heart attack.  A stroke.  A weakened blood vessel (aneurysm).  Heart failure.  Kidney damage.  Eye damage.  Metabolic syndrome.  Memory and concentration problems. What changes can I make to manage my hypertension? Hypertension can be managed by making lifestyle changes and possibly by taking medicines. Your health care provider will help you make a plan to bring your blood pressure within  a normal range. Eating and drinking   Eat a diet that is high in fiber and potassium, and low in salt (sodium), added sugar, and fat. An example eating plan is called the DASH (Dietary Approaches to Stop Hypertension) diet. To eat this way: ? Eat plenty of fresh fruits and vegetables. Try to fill half of your plate at each meal with fruits and vegetables. ? Eat whole grains, such as whole wheat pasta, brown rice, or whole grain bread. Fill about one quarter of your plate with whole grains. ? Eat low-fat diary products. ? Avoid fatty cuts of meat, processed or cured meats, and poultry with skin. Fill about one quarter of your plate with lean proteins such as  fish, chicken without skin, beans, eggs, and tofu. ? Avoid premade and processed foods. These tend to be higher in sodium, added sugar, and fat.  Reduce your daily sodium intake. Most people with hypertension should eat less than 1,500 mg of sodium a day.  Limit alcohol intake to no more than 1 drink a day for nonpregnant women and 2 drinks a day for men. One drink equals 12 oz of beer, 5 oz of wine, or 1 oz of hard liquor. Lifestyle  Work with your health care provider to maintain a healthy body weight, or to lose weight. Ask what an ideal weight is for you.  Get at least 30 minutes of exercise that causes your heart to beat faster (aerobic exercise) most days of the week. Activities may include walking, swimming, or biking.  Include exercise to strengthen your muscles (resistance exercise), such as weight lifting, as part of your weekly exercise routine. Try to do these types of exercises for 30 minutes at least 3 days a week.  Do not use any products that contain nicotine or tobacco, such as cigarettes and e-cigarettes. If you need help quitting, ask your health care provider.  Control any long-term (chronic) conditions you have, such as high cholesterol or diabetes. Monitoring  Monitor your blood pressure at home as told by your health care provider. Your personal target blood pressure may vary depending on your medical conditions, your age, and other factors.  Have your blood pressure checked regularly, as often as told by your health care provider. Working with your health care provider  Review all the medicines you take with your health care provider because there may be side effects or interactions.  Talk with your health care provider about your diet, exercise habits, and other lifestyle factors that may be contributing to hypertension.  Visit your health care provider regularly. Your health care provider can help you create and adjust your plan for managing hypertension. Will  I need medicine to control my blood pressure? Your health care provider may prescribe medicine if lifestyle changes are not enough to get your blood pressure under control, and if:  Your systolic blood pressure is 130 or higher.  Your diastolic blood pressure is 80 or higher. Take medicines only as told by your health care provider. Follow the directions carefully. Blood pressure medicines must be taken as prescribed. The medicine does not work as well when you skip doses. Skipping doses also puts you at risk for problems. Contact a health care provider if:  You think you are having a reaction to medicines you have taken.  You have repeated (recurrent) headaches.  You feel dizzy.  You have swelling in your ankles.  You have trouble with your vision. Get help right away if:  You develop  a severe headache or confusion.  You have unusual weakness or numbness, or you feel faint.  You have severe pain in your chest or abdomen.  You vomit repeatedly.  You have trouble breathing. Summary  Hypertension is when the force of blood pumping through your arteries is too strong. If this condition is not controlled, it may put you at risk for serious complications.  Your personal target blood pressure may vary depending on your medical conditions, your age, and other factors. For most people, a normal blood pressure is less than 120/80.  Hypertension is managed by lifestyle changes, medicines, or both. Lifestyle changes include weight loss, eating a healthy, low-sodium diet, exercising more, and limiting alcohol. This information is not intended to replace advice given to you by your health care provider. Make sure you discuss any questions you have with your health care provider. Document Revised: 04/20/2018 Document Reviewed: 11/25/2015 Elsevier Patient Education  2020 [place] Maintenance, Male Adopting a healthy lifestyle and getting preventive care are important in  promoting health and wellness. Ask your health care provider about:  The right schedule for you to have regular tests and exams.  Things you can do on your own to prevent diseases and keep yourself healthy. What should I know about diet, weight, and exercise? Eat a healthy diet   Eat a diet that includes plenty of vegetables, fruits, low-fat dairy products, and lean protein.  Do not eat a lot of foods that are high in solid fats, added sugars, or sodium. Maintain a healthy weight Body mass index (BMI) is a measurement that can be used to identify possible weight problems. It estimates body fat based on height and weight. Your health care provider can help determine your BMI and help you achieve or maintain a healthy weight. Get regular exercise Get regular exercise. This is one of the most important things you can do for your health. Most adults should:  Exercise for at least 150 minutes each week. The exercise should increase your heart rate and make you sweat (moderate-intensity exercise).  Do strengthening exercises at least twice a week. This is in addition to the moderate-intensity exercise.  Spend less time sitting. Even light physical activity can be beneficial. Watch cholesterol and blood lipids Have your blood tested for lipids and cholesterol at 64 years of age, then have this test every 5 years. You may need to have your cholesterol levels checked more often if:  Your lipid or cholesterol levels are high.  You are older than 64 years of age.  You are at high risk for heart disease. What should I know about cancer screening? Many types of cancers can be detected early and may often be prevented. Depending on your health history and family history, you may need to have cancer screening at various ages. This may include screening for:  Colorectal cancer.  Prostate cancer.  Skin cancer.  Lung cancer. What should I know about heart disease, diabetes, and high blood  pressure? Blood pressure and heart disease  High blood pressure causes heart disease and increases the risk of stroke. This is more likely to develop in people who have high blood pressure readings, are of African descent, or are overweight.  Talk with your health care provider about your target blood pressure readings.  Have your blood pressure checked: ? Every 3-5 years if you are 34-74 years of age. ? Every year if you are 65 years old or older.  If you are between  the ages of 11 and 77 and are a current or former smoker, ask your health care provider if you should have a one-time screening for abdominal aortic aneurysm (AAA). Diabetes Have regular diabetes screenings. This checks your fasting blood sugar level. Have the screening done:  Once every three years after age 60 if you are at a normal weight and have a low risk for diabetes.  More often and at a younger age if you are overweight or have a high risk for diabetes. What should I know about preventing infection? Hepatitis B If you have a higher risk for hepatitis B, you should be screened for this virus. Talk with your health care provider to find out if you are at risk for hepatitis B infection. Hepatitis C Blood testing is recommended for:  Everyone born from [birth year] through 1965.  Anyone with known risk factors for hepatitis C. Sexually transmitted infections (STIs)  You should be screened each year for STIs, including gonorrhea and chlamydia, if: ? You are sexually active and are younger than 64 years of age. ? You are older than 64 years of age and your health care provider tells you that you are at risk for this type of infection. ? Your sexual activity has changed since you were last screened, and you are at increased risk for chlamydia or gonorrhea. Ask your health care provider if you are at risk.  Ask your health care provider about whether you are at high risk for HIV. Your health care provider may recommend a  prescription medicine to help prevent HIV infection. If you choose to take medicine to prevent HIV, you should first get tested for HIV. You should then be tested every 3 months for as long as you are taking the medicine. Follow these instructions at home: Lifestyle  Do not use any products that contain nicotine or tobacco, such as cigarettes, e-cigarettes, and chewing tobacco. If you need help quitting, ask your health care provider.  Do not use street drugs.  Do not share needles.  Ask your health care provider for help if you need support or information about quitting drugs. Alcohol use  Do not drink alcohol if your health care provider tells you not to drink.  If you drink alcohol: ? Limit how much you have to 0-2 drinks a day. ? Be aware of how much alcohol is in your drink. In the [place] one drink equals one 12 oz bottle of beer (355 mL), one 5 oz glass of wine (148 mL), or one 1 oz glass of hard liquor (44 mL). General instructions  Schedule regular health, dental, and eye exams.  Stay current with your vaccines.  Tell your health care provider if: ? You often feel depressed. ? You have ever been abused or do not feel safe at home. Summary  Adopting a healthy lifestyle and getting preventive care are important in promoting health and wellness.  Follow your health care provider's instructions about healthy diet, exercising, and getting tested or screened for diseases.  Follow your health care provider's instructions on monitoring your cholesterol and blood pressure. This information is not intended to replace advice given to you by your health care provider. Make sure you discuss any questions you have with your health care provider. Document Revised: 12/20/2017 Document Reviewed: 12/20/2017 Elsevier Patient Education  2020 [employer]  Preventive Care 59-48 Years Old, Male Preventive care refers to lifestyle choices and visits with your health care provider that can  promote health and  wellness. This includes:  A yearly physical exam. This is also called an annual well check.  Regular dental and eye exams.  Immunizations.  Screening for certain conditions.  Healthy lifestyle choices, such as eating a healthy diet, getting regular exercise, not using drugs or products that contain nicotine and tobacco, and limiting alcohol use. What can I expect for my preventive care visit? Physical exam Your health care provider will check:  Height and weight. These may be used to calculate body mass index (BMI), which is a measurement that tells if you are at a healthy weight.  Heart rate and blood pressure.  Your skin for abnormal spots. Counseling Your health care provider may ask you questions about:  Alcohol, tobacco, and drug use.  Emotional well-being.  Home and relationship well-being.  Sexual activity.  Eating habits.  Work and work Astronomer. What immunizations do I need?  Influenza (flu) vaccine  This is recommended every year. Tetanus, diphtheria, and pertussis (Tdap) vaccine  You may need a Td booster every 10 years. Varicella (chickenpox) vaccine  You may need this vaccine if you have not already been vaccinated. Zoster (shingles) vaccine  You may need this after age 45. Measles, mumps, and rubella (MMR) vaccine  You may need at least one dose of MMR if you were born in 1957 or later. You may also need a second dose. Pneumococcal conjugate (PCV13) vaccine  You may need this if you have certain conditions and were not previously vaccinated. Pneumococcal polysaccharide (PPSV23) vaccine  You may need one or two doses if you smoke cigarettes or if you have certain conditions. Meningococcal conjugate (MenACWY) vaccine  You may need this if you have certain conditions. Hepatitis A vaccine  You may need this if you have certain conditions or if you travel or work in places where you may be exposed to hepatitis A. Hepatitis  B vaccine  You may need this if you have certain conditions or if you travel or work in places where you may be exposed to hepatitis B. Haemophilus influenzae type b (Hib) vaccine  You may need this if you have certain risk factors. Human papillomavirus (HPV) vaccine  If recommended by your health care provider, you may need three doses over 6 months. You may receive vaccines as individual doses or as more than one vaccine together in one shot (combination vaccines). Talk with your health care provider about the risks and benefits of combination vaccines. What tests do I need? Blood tests  Lipid and cholesterol levels. These may be checked every 5 years, or more frequently if you are over 35 years old.  Hepatitis C test.  Hepatitis B test. Screening  Lung cancer screening. You may have this screening every year starting at age 83 if you have a 30-pack-year history of smoking and currently smoke or have quit within the past 15 years.  Prostate cancer screening. Recommendations will vary depending on your family history and other risks.  Colorectal cancer screening. All adults should have this screening starting at age 74 and continuing until age 36. Your health care provider may recommend screening at age 28 if you are at increased risk. You will have tests every 1-10 years, depending on your results and the type of screening test.  Diabetes screening. This is done by checking your blood sugar (glucose) after you have not eaten for a while (fasting). You may have this done every 1-3 years.  Sexually transmitted disease (STD) testing. Follow these instructions at home: Eating  and drinking  Eat a diet that includes fresh fruits and vegetables, whole grains, lean protein, and low-fat dairy products.  Take vitamin and mineral supplements as recommended by your health care provider.  Do not drink alcohol if your health care provider tells you not to drink.  If you drink  alcohol: ? Limit how much you have to 0-2 drinks a day. ? Be aware of how much alcohol is in your drink. In the [place] one drink equals one 12 oz bottle of beer (355 mL), one 5 oz glass of wine (148 mL), or one 1 oz glass of hard liquor (44 mL). Lifestyle  Take daily care of your teeth and gums.  Stay active. Exercise for at least 30 minutes on 5 or more days each week.  Do not use any products that contain nicotine or tobacco, such as cigarettes, e-cigarettes, and chewing tobacco. If you need help quitting, ask your health care provider.  If you are sexually active, practice safe sex. Use a condom or other form of protection to prevent STIs (sexually transmitted infections).  Talk with your health care provider about taking a low-dose aspirin every day starting at age 58. What's next?  Go to your health care provider once a year for a well check visit.  Ask your health care provider how often you should have your eyes and teeth checked.  Stay up to date on all vaccines. This information is not intended to replace advice given to you by your health care provider. Make sure you discuss any questions you have with your health care provider. Document Revised: 12/21/2017 Document Reviewed: 12/21/2017 Elsevier Patient Education  2020 [employer].

## 2019-09-25 NOTE — Addendum Note (Signed)
Addended by: Varney Biles on: 09/25/2019 03:56 PM   Modules accepted: Orders

## 2019-09-25 NOTE — Addendum Note (Signed)
Addended by: Lake Bells on: 09/25/2019 01:25 PM   Modules accepted: Orders

## 2019-09-25 NOTE — Addendum Note (Signed)
Addended by: Corben Auzenne M on: 09/25/2019 03:56 PM   Modules accepted: Orders  

## 2019-09-26 DIAGNOSIS — E7800 Pure hypercholesterolemia, unspecified: Secondary | ICD-10-CM | POA: Insufficient documentation

## 2019-09-26 LAB — URINALYSIS, ROUTINE W REFLEX MICROSCOPIC
Bilirubin Urine: NEGATIVE
Glucose, UA: NEGATIVE
Hgb urine dipstick: NEGATIVE
Ketones, ur: NEGATIVE
Leukocytes,Ua: NEGATIVE
Nitrite: NEGATIVE
Protein, ur: NEGATIVE
Specific Gravity, Urine: 1.008 (ref 1.001–1.03)
pH: 6 (ref 5.0–8.0)

## 2019-09-26 MED ORDER — ATORVASTATIN CALCIUM 20 MG PO TABS: 20.0000 mg | ORAL_TABLET | Freq: Every day | ORAL | 3 refills | Status: DC

## 2019-09-26 NOTE — Addendum Note (Signed)
Addended by: Andrez Grime on: 09/26/2019 07:36 AM   Modules accepted: Orders

## 2019-09-27 ENCOUNTER — Telehealth: Payer: Self-pay | Admitting: Family Medicine

## 2019-09-27 NOTE — Telephone Encounter (Signed)
[facility] called and asked about the ABI put in because pt had one done on 05/23/19. They didn't know if it was a mistake, I asked Brendia Sacks and she talked with Dr Doreene Burke about it and he said it was a mistake. I called Pearl back and let her know

## 2019-11-19 ENCOUNTER — Other Ambulatory Visit: Payer: Self-pay | Admitting: Family Medicine

## 2019-11-19 DIAGNOSIS — F418 Other specified anxiety disorders: Secondary | ICD-10-CM

## 2019-12-26 ENCOUNTER — Ambulatory Visit: Payer: 59 | Admitting: Family Medicine

## 2019-12-31 ENCOUNTER — Other Ambulatory Visit: Payer: Self-pay | Admitting: Family Medicine

## 2019-12-31 DIAGNOSIS — I10 Essential (primary) hypertension: Secondary | ICD-10-CM

## 2020-01-28 ENCOUNTER — Ambulatory Visit: Payer: 59 | Admitting: Family Medicine

## 2020-02-24 ENCOUNTER — Ambulatory Visit: Payer: 59 | Admitting: Family Medicine

## 2020-02-26 ENCOUNTER — Telehealth: Payer: Self-pay | Admitting: Family Medicine

## 2020-02-26 NOTE — Telephone Encounter (Signed)
Patient is calling to get a refill on his Escitalopram 20mg . If approved, please send to and call him at [phone] to let him know that it has been sent in.

## 2020-03-02 ENCOUNTER — Other Ambulatory Visit: Payer: Self-pay

## 2020-03-02 DIAGNOSIS — F418 Other specified anxiety disorders: Secondary | ICD-10-CM

## 2020-03-02 MED ORDER — ESCITALOPRAM OXALATE 20 MG PO TABS: 20.0000 mg | ORAL_TABLET | Freq: Every day | ORAL | 0 refills | Status: DC

## 2020-03-02 NOTE — Telephone Encounter (Signed)
Patient aware Rx sent to pharmacy. °

## 2020-03-02 NOTE — Telephone Encounter (Signed)
Refill request for pending medication, last refill 11/19/19  Last OV 09/25/19. Patient has an upcoming appointment 03/06/20 with Dr. Veto Kemps. Please advise.

## 2020-03-05 ENCOUNTER — Other Ambulatory Visit: Payer: Self-pay

## 2020-03-06 ENCOUNTER — Encounter: Payer: Self-pay | Admitting: Family Medicine

## 2020-03-06 ENCOUNTER — Ambulatory Visit: Payer: 59 | Admitting: Family Medicine

## 2020-03-06 VITALS — BP 140/90 | HR 65 | Temp 98.2°F | Ht 69.0 in | Wt 207.0 lb

## 2020-03-06 DIAGNOSIS — F418 Other specified anxiety disorders: Secondary | ICD-10-CM

## 2020-03-06 DIAGNOSIS — R7303 Prediabetes: Secondary | ICD-10-CM | POA: Insufficient documentation

## 2020-03-06 DIAGNOSIS — I10 Essential (primary) hypertension: Secondary | ICD-10-CM

## 2020-03-06 DIAGNOSIS — E782 Mixed hyperlipidemia: Secondary | ICD-10-CM | POA: Diagnosis not present

## 2020-03-06 DIAGNOSIS — Z720 Tobacco use: Secondary | ICD-10-CM

## 2020-03-06 MED ORDER — HYDROCHLOROTHIAZIDE 25 MG PO TABS: 25.0000 mg | ORAL_TABLET | Freq: Every day | ORAL | 3 refills | Status: DC

## 2020-03-06 NOTE — Progress Notes (Signed)
[facility] [address] [place] [address] Dept: [phone] Dept Fax: [phone]  Chronic Care Office Visit  Subjective:    Patient ID: Fortunato Nordin, male    DOB: Jun 19, 1955, 65 y.o..   MRN: [id number]  Chief Complaint  Patient presents with  . Hypertension    3 month follow up visit.  Pt has no complaints today.  No refills needed today.    History of Present Illness:  Patient is in today for reassessment of his chronic medical issues. Mr. Zelman was seen by Dr. Doreene Burke in Sept. He was started on medication for hypertension and hyperlipidemia. He denies any problems with the medication. He continues to smoke about 1 1/2 ppd. He is considering using a product available OTC at [facility] to assist with smoking cessation. He has a friend that used this successfully.  Mr. Hammerschmidt has a history of anxiety and depression. He has been dealing with multiple stressors related to deaths of family members and providing care to multiple family members. He is not currently in counselign, noting time as a major constraint for this.  Past Medical History: Patient Active Problem List   Diagnosis Date Noted  . Elevated LDL cholesterol level 09/26/2019  . Essential hypertension 09/25/2019  . Tobacco use 05/13/2019  . Decreased pedal pulses 05/13/2019  . Depression with anxiety 05/13/2019  . History of diabetes mellitus, type II 05/13/2019  . Healthcare maintenance 05/13/2019  . 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  . Tremor 02/26/2015  . Radiculopathy 02/26/2015  . IFG (impaired fasting glucose) 02/26/2015  . Hyperlipidemia, unspecified 02/26/2015   Past Surgical History:  Procedure Laterality Date  . SHOULDER SURGERY Right 06/10/2009   Family History  Problem Relation Age of Onset  . Depression Mother    Outpatient Medications Prior to Visit  Medication Sig Dispense Refill  . atorvastatin (LIPITOR) 20 MG tablet Take 1 tablet  (20 mg total) by mouth daily. 90 tablet 3  . escitalopram (LEXAPRO) 20 MG tablet Take 1 tablet (20 mg total) by mouth daily. 90 tablet 0  . lisinopril (ZESTRIL) 20 MG tablet TAKE ONE TABLET BY MOUTH DAILY 90 tablet 0  . aspirin 81 MG EC tablet Take by mouth. (Patient not taking: Reported on 03/06/2020)    . ibuprofen (ADVIL) 100 MG/5ML suspension Take 200 mg by mouth every 4 (four) hours as needed. (Patient not taking: No sig reported)     No facility-administered medications prior to visit.   Allergies  Allergen Reactions  . Prednisone Other (See Comments)    Dyspepsia    Objective:   Today's Vitals   03/06/20 1334  BP: (!) 140/100  Pulse: 65  Temp: 98.2 F (36.8 C)  TempSrc: Temporal  SpO2: 97%  Weight: 207 lb (93.9 kg)  Height: 5\' 9"  (1.753 m)   Body mass index is 30.57 kg/m.   General: Well developed, well nourished. No acute distress. Psych: Alert and oriented x3. Normal mood and affect.  Health Maintenance Due  Topic Date Due  . Hepatitis C Screening  Never done  . HIV Screening  Never done  . COVID-19 Vaccine (3 - Booster for Moderna series) 01/12/2020  . TETANUS/TDAP  02/02/2020     Assessment & Plan:   1. Essential hypertension Mr. Willemsen blood pressure remains above goal. I recommend adding HCTZ to his lisinopril. We should reassess within 3 months. I will check a BMP to be sure his  K and renal function are okay.  -  Basic metabolic panel - hydrochlorothiazide (HYDRODIURIL) 25 MG tablet; Take 1 tablet (25 mg total) by mouth daily.  Dispense: 90 tablet; Refill: 3  2. Tobacco use I advised smoking cessation and encouraged him to continue efforts to quit.  3. Depression with anxiety We discussed whether he may need a change in his Lexapro to a different SSRI. He feels that since his recent loss, his grief is improving. We will monitor this for now.  4. Mixed hyperlipidemia On Lipitor. We will check to see response to this.  - Lipid panel  Loyola Mast, MD

## 2020-03-07 LAB — LIPID PANEL
Cholesterol: 131 mg/dL (ref <200)
HDL: 40 mg/dL (ref >=40)
LDL Cholesterol (Calc): 74 mg/dL (calc)
Non-HDL Cholesterol (Calc): 91 mg/dL (calc) (ref <130)
Total CHOL/HDL Ratio: 3.3 (calc) (ref <5.0)
Triglycerides: 89 mg/dL (ref <150)

## 2020-03-07 LAB — BASIC METABOLIC PANEL
BUN: 9 mg/dL (ref 7–25)
CO2: 26 mmol/L (ref 20–32)
Calcium: 9.2 mg/dL (ref 8.6–10.3)
Chloride: 106 mmol/L (ref 98–110)
Creat: 0.83 mg/dL (ref 0.70–1.25)
Glucose, Bld: 97 mg/dL (ref 65–99)
Potassium: 4.2 mmol/L (ref 3.5–5.3)
Sodium: 142 mmol/L (ref 135–146)

## 2020-03-27 ENCOUNTER — Ambulatory Visit: Payer: 59 | Admitting: Family Medicine

## 2020-04-06 ENCOUNTER — Telehealth: Payer: Self-pay | Admitting: Family Medicine

## 2020-04-06 ENCOUNTER — Other Ambulatory Visit: Payer: Self-pay

## 2020-04-06 DIAGNOSIS — I10 Essential (primary) hypertension: Secondary | ICD-10-CM

## 2020-04-06 MED ORDER — LISINOPRIL 20 MG PO TABS: 20.0000 mg | ORAL_TABLET | Freq: Every day | ORAL | 0 refills | Status: DC

## 2020-04-06 NOTE — Telephone Encounter (Signed)
Pt is stating he thinks his hydrochlorothiazide (HYDRODIURIL) 25 MG tablet [[id number]] could be making his fingers tingle and they also get numb several times in a day. He also is having heartburn and having to take TUMS about 10times a day.  He also gets trembles in his arm, but he thinks that is because of his prior shoulder shoulder. He just let me know this has been going on for a decade.  Please advise pt at [phone].

## 2020-04-14 NOTE — Telephone Encounter (Signed)
Appointment scheduled to evaluate concerns above.

## 2020-04-17 ENCOUNTER — Ambulatory Visit: Payer: 59 | Admitting: Family Medicine

## 2020-05-13 ENCOUNTER — Other Ambulatory Visit: Payer: Self-pay

## 2020-05-15 ENCOUNTER — Other Ambulatory Visit: Payer: Self-pay

## 2020-05-15 ENCOUNTER — Encounter: Payer: Self-pay | Admitting: Family Medicine

## 2020-05-15 ENCOUNTER — Ambulatory Visit: Payer: 59 | Admitting: Family Medicine

## 2020-05-15 VITALS — BP 150/80 | HR 78 | Temp 97.3°F | Wt 207.4 lb

## 2020-05-15 DIAGNOSIS — Z8639 Personal history of other endocrine, nutritional and metabolic disease: Secondary | ICD-10-CM

## 2020-05-15 DIAGNOSIS — F418 Other specified anxiety disorders: Secondary | ICD-10-CM | POA: Diagnosis not present

## 2020-05-15 DIAGNOSIS — Z720 Tobacco use: Secondary | ICD-10-CM | POA: Diagnosis not present

## 2020-05-15 DIAGNOSIS — I10 Essential (primary) hypertension: Secondary | ICD-10-CM

## 2020-05-15 DIAGNOSIS — M6281 Muscle weakness (generalized): Secondary | ICD-10-CM

## 2020-05-15 DIAGNOSIS — E7800 Pure hypercholesterolemia, unspecified: Secondary | ICD-10-CM

## 2020-05-15 MED ORDER — LISINOPRIL 40 MG PO TABS: 40.0000 mg | ORAL_TABLET | Freq: Every day | ORAL | 3 refills | Status: DC

## 2020-05-15 NOTE — Patient Instructions (Signed)
Preventing High Cholesterol Cholesterol is a white, waxy substance similar to fat that the human body needs to help build cells. The liver makes all the cholesterol that a person's body needs. Having high cholesterol (hypercholesterolemia) increases your risk for heart disease and stroke. Extra or excess cholesterol comes from the food that you eat. High cholesterol can often be prevented with diet and lifestyle changes. If you already have high cholesterol, you can control it with diet, lifestyle changes, and medicines. How can high cholesterol affect me? If you have high cholesterol, fatty deposits (plaques) may build up on the walls of your blood vessels. The blood vessels that carry blood away from your heart are called arteries. Plaques make the arteries narrower and stiffer. This in turn can:  Restrict or block blood flow and cause blood clots to form.  Increase your risk for heart attack and stroke. What can increase my risk for high cholesterol? This condition is more likely to develop in people who:  Eat foods that are high in saturated fat or cholesterol. Saturated fat is mostly found in foods that come from animal sources.  Are overweight.  Are not getting enough exercise.  Have a family history of high cholesterol (familial hypercholesterolemia). What actions can I take to prevent this? Nutrition  Eat less saturated fat.  Avoid trans fats (partially hydrogenated oils). These are often found in margarine and in some baked goods, fried foods, and snacks bought in packages.  Avoid precooked or cured meat, such as bacon, sausages, or meat loaves.  Avoid foods and drinks that have added sugars.  Eat more fruits, vegetables, and whole grains.  Choose healthy sources of protein, such as fish, poultry, lean cuts of red meat, beans, peas, lentils, and nuts.  Choose healthy sources of fat, such as: ? Nuts. ? Vegetable oils, especially olive oil. ? Fish that have healthy fats,  such as omega-3 fatty acids. These fish include mackerel or salmon.   Lifestyle  Lose weight if you are overweight. Maintaining a healthy body mass index (BMI) can help prevent or control high cholesterol. It can also lower your risk for diabetes and high blood pressure. Ask your health care provider to help you with a diet and exercise plan to lose weight safely.  Do not use any products that contain nicotine or tobacco, such as cigarettes, e-cigarettes, and chewing tobacco. If you need help quitting, ask your health care provider. Alcohol use  Do not drink alcohol if: ? Your health care provider tells you not to drink. ? You are pregnant, may be pregnant, or are planning to become pregnant.  If you drink alcohol: ? Limit how much you use to:  0-1 drink a day for women.  0-2 drinks a day for men. ? Be aware of how much alcohol is in your drink. In the [place] one drink equals one 12 oz bottle of beer (355 mL), one 5 oz glass of wine (148 mL), or one 1 oz glass of hard liquor (44 mL). Activity  Get enough exercise. Do exercises as told by your health care provider.  Each week, do at least 150 minutes of exercise that takes a medium level of effort (moderate-intensity exercise). This kind of exercise: ? Makes your heart beat faster while allowing you to still be able to talk. ? Can be done in short sessions several times a day or longer sessions a few times a week. For example, on 5 days each week, you could walk fast or ride   your bike 3 times a day for 10 minutes each time.   Medicines  Your health care provider may recommend medicines to help lower cholesterol. This may be a medicine to lower the amount of cholesterol that your liver makes. You may need medicine if: ? Diet and lifestyle changes have not lowered your cholesterol enough. ? You have high cholesterol and other risk factors for heart disease or stroke.  Take over-the-counter and prescription medicines only as told by your  health care provider. General information  Manage your risk factors for high cholesterol. Talk with your health care provider about all your risk factors and how to lower your risk.  Manage other conditions that you have, such as diabetes or high blood pressure (hypertension).  Have blood tests to check your cholesterol levels at regular points in time as told by your health care provider.  Keep all follow-up visits as told by your health care provider. This is important. Where to find more information  American Heart Association: www.heart.org  National Heart, Lung, and Blood Institute: www.nhlbi.nih.gov Summary  High cholesterol increases your risk for heart disease and stroke. By keeping your cholesterol level low, you can reduce your risk for these conditions.  High cholesterol can often be prevented with diet and lifestyle changes.  Work with your health care provider to manage your risk factors, and have your blood tested regularly. This information is not intended to replace advice given to you by your health care provider. Make sure you discuss any questions you have with your health care provider. Document Revised: 10/09/2018 Document Reviewed: 10/09/2018 Elsevier Patient Education  2021 Elsevier Inc.  

## 2020-05-15 NOTE — Progress Notes (Signed)
Established Patient Office Visit  Subjective:  Patient ID: Wayne Wheeler, male    DOB: Jun 03, 1955  Age: 65 y.o. MRN: [id number]  CC:  Chief Complaint  Patient presents with  . Acute Visit    Pt states he has tingling in his finger, has aches in his joints, having forgetfulness, dizziness when standing up. Pt is curious to know if HCTZ is causng his heartburn to be worse than nornal     HPI Wayne Wheeler presents for follow-up of hypertension, elevated hemoglobin A1c, elevated ldl cholesterol and depression.  Lexapro continues to work well for him.  He has been compliant with his lisinopril and HCTZ.  Having no issues taking it.  Continues to take the atorvastatin but has concerns.  Has read on the Internet that it can cause dementia, diabetes and muscle pains.  He has been having muscle pains in his shoulders and thighs.  He has had trouble performing overhead work.  He is worried about his memory a bit.  No family members have expressed to concerned yet.  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  He has never gotten lost.  Continues to smoke.  Past Medical History:  Diagnosis Date  . Depression     Past Surgical History:  Procedure Laterality Date  . SHOULDER SURGERY Right 06/10/2009    Family History  Problem Relation Age of Onset  . Depression Mother     Social History   Socioeconomic History  . Marital status: Unknown    Spouse name: Not on file  . Number of children: Not on file  . Years of education: Not on file  . Highest education level: Not on file  Occupational History  . Not on file  Tobacco Use  . Smoking status: Current Some Day Smoker    Packs/day: 1.00  . Smokeless tobacco: Never Used  Vaping Use  . Vaping Use: Never used  Substance and Sexual Activity  . Alcohol use: Yes    Alcohol/week: 1.0 standard drink    Types: 1 Cans of beer per week    Comment: social  . Drug use: Never  . Sexual activity: Not  on file  Other Topics Concern  . Not on file  Social History Narrative  . Not on file   Social Determinants of Health   Financial Resource Strain: Not on file  Food Insecurity: Not on file  Transportation Needs: Not on file  Physical Activity: Not on file  Stress: Not on file  Social Connections: Not on file  Intimate Partner Violence: Not on file    Outpatient Medications Prior to Visit  Medication Sig Dispense Refill  . atorvastatin (LIPITOR) 20 MG tablet Take 1 tablet (20 mg total) by mouth daily. 90 tablet 3  . escitalopram (LEXAPRO) 20 MG tablet Take 1 tablet (20 mg total) by mouth daily. 90 tablet 0  . hydrochlorothiazide (HYDRODIURIL) 25 MG tablet Take 1 tablet (25 mg total) by mouth daily. 90 tablet 3  . lisinopril (ZESTRIL) 20 MG tablet Take 1 tablet (20 mg total) by mouth daily. 90 tablet 0   No facility-administered medications prior to visit.    Allergies  Allergen Reactions  . Prednisone Other (See Comments)    Dyspepsia    ROS Review of Systems  Constitutional: Negative.   HENT: Negative.   Eyes: Negative for photophobia and visual disturbance.  Respiratory: Negative.   Cardiovascular: Negative.   Gastrointestinal: Negative.   Endocrine:  Negative for polyphagia and polyuria.  Genitourinary: Negative.   Musculoskeletal: Positive for myalgias.  Psychiatric/Behavioral: Negative.       Objective:    Physical Exam Vitals and nursing note reviewed.  Constitutional:      General: He is not in acute distress.    Appearance: Normal appearance. He is not ill-appearing, toxic-appearing or diaphoretic.  HENT:     Head: Normocephalic and atraumatic.     Right Ear: Tympanic membrane, ear canal and external ear normal.     Left Ear: Tympanic membrane, ear canal and external ear normal.     Mouth/Throat:     Mouth: Mucous membranes are moist.     Pharynx: Oropharynx is clear. No oropharyngeal exudate or posterior oropharyngeal erythema.  Eyes:     General:  No scleral icterus.       Right eye: No discharge.        Left eye: No discharge.     Extraocular Movements: Extraocular movements intact.     Conjunctiva/sclera: Conjunctivae normal.     Pupils: Pupils are equal, round, and reactive to light.  Neck:     Vascular: No carotid bruit.  Cardiovascular:     Rate and Rhythm: Normal rate and regular rhythm.     Pulses:          Carotid pulses are 1+ on the right side and 1+ on the left side. Pulmonary:     Effort: Pulmonary effort is normal.  Abdominal:     General: Bowel sounds are normal.  Musculoskeletal:     Cervical back: No rigidity or tenderness.  Lymphadenopathy:     Cervical: No cervical adenopathy.  Skin:    General: Skin is warm and dry.  Neurological:     Mental Status: He is alert and oriented to person, place, and time.  Psychiatric:        Mood and Affect: Mood normal.        Behavior: Behavior normal.     Mini-Cog - 05/15/20 1437    Normal clock drawing test? yes    How many words correct? 3          BP (!) 150/80 (BP Location: Right Arm, Patient Position: Sitting, Cuff Size: Normal)   Pulse 78   Temp (!) 97.3 F (36.3 C) (Oral)   Wt 207 lb 6.4 oz (94.1 kg)   SpO2 98%   BMI 30.63 kg/m  Wt Readings from Last 3 Encounters:  05/15/20 207 lb 6.4 oz (94.1 kg)  03/06/20 207 lb (93.9 kg)  09/25/19 203 lb 3.2 oz (92.2 kg)     Health Maintenance Due  Topic Date Due  . HIV Screening  Never done  . Hepatitis C Screening  Never done  . COVID-19 Vaccine (3 - Booster for Moderna series) 01/12/2020  . TETANUS/TDAP  02/02/2020    There are no preventive care reminders to display for this patient.  No results found for: TSH Lab Results  Component Value Date   WBC 7.0 09/25/2019   HGB 15.8 09/25/2019   HCT 46.8 09/25/2019   MCV 98.2 09/25/2019   PLT 147.0 (L) 09/25/2019   Lab Results  Component Value Date   NA 142 03/06/2020   K 4.2 03/06/2020   CO2 26 03/06/2020   GLUCOSE 97 03/06/2020   BUN 9  03/06/2020   CREATININE 0.83 03/06/2020   BILITOT 0.4 09/25/2019   ALKPHOS 68 09/25/2019   AST 16 09/25/2019   ALT 23 09/25/2019   PROT 7.0  09/25/2019   ALBUMIN 4.4 09/25/2019   CALCIUM 9.2 03/06/2020   GFR 76.10 09/25/2019   Lab Results  Component Value Date   CHOL 131 03/06/2020   Lab Results  Component Value Date   HDL 40 03/06/2020   Lab Results  Component Value Date   LDLCALC 74 03/06/2020   Lab Results  Component Value Date   TRIG 89 03/06/2020   Lab Results  Component Value Date   CHOLHDL 3.3 03/06/2020   Lab Results  Component Value Date   HGBA1C 6.1 09/25/2019      Assessment & Plan:   Problem List Items Addressed This Visit      Cardiovascular and Mediastinum   Essential hypertension - Primary   Relevant Medications   lisinopril (ZESTRIL) 40 MG tablet   Other Relevant Orders   CBC   Comprehensive metabolic panel     Other   Tobacco use   Depression with anxiety   History of diabetes mellitus, type II   Relevant Orders   Hemoglobin A1c   Elevated LDL cholesterol level   Relevant Orders   Lipid panel   Proximal muscle weakness   Relevant Orders   Sedimentation rate      Meds ordered this encounter  Medications  . lisinopril (ZESTRIL) 40 MG tablet    Sig: Take 1 tablet (40 mg total) by mouth daily.    Dispense:  90 tablet    Refill:  3    Follow-up: Return in about 3 months (around 08/15/2020), or if symptoms worsen or fail to improve.   Advised patient to take a holiday from his statin for a couple of weeks to see if the muscle aches improved.  If they do improve, he is to restart the statin to see if they have returned.  He does endorse proximal muscle weakness, will check for PMR with a sed rate.  Urged him to stop smoking.  He did well with the mini cog test.  Have increased lisinopril to 40 mg daily. Mliss Sax, MD

## 2020-05-16 LAB — LIPID PANEL
Cholesterol: 123 mg/dL (ref <200)
HDL: 38 mg/dL — ABNORMAL LOW (ref >=40)
LDL Cholesterol (Calc): 66 mg/dL (calc)
Non-HDL Cholesterol (Calc): 85 mg/dL (calc) (ref <130)
Total CHOL/HDL Ratio: 3.2 (calc) (ref <5.0)
Triglycerides: 111 mg/dL (ref <150)

## 2020-05-16 LAB — CBC
HCT: 41.2 % (ref 38.5–50.0)
Hemoglobin: 14.6 g/dL (ref 13.2–17.1)
MCH: 34.2 pg — ABNORMAL HIGH (ref 27.0–33.0)
MCHC: 35.4 g/dL (ref 32.0–36.0)
MCV: 96.5 fL (ref 80.0–100.0)
MPV: 12.4 fL (ref 7.5–12.5)
Platelets: 158 10*3/uL (ref 140–400)
RBC: 4.27 10*6/uL (ref 4.20–5.80)
RDW: 13.2 % (ref 11.0–15.0)
WBC: 7 10*3/uL (ref 3.8–10.8)

## 2020-05-16 LAB — COMPREHENSIVE METABOLIC PANEL
AG Ratio: 1.8 (calc) (ref 1.0–2.5)
ALT: 30 U/L (ref 9–46)
AST: 20 U/L (ref 10–35)
Albumin: 4.3 g/dL (ref 3.6–5.1)
Alkaline phosphatase (APISO): 66 U/L (ref 35–144)
BUN: 12 mg/dL (ref 7–25)
CO2: 24 mmol/L (ref 20–32)
Calcium: 9.4 mg/dL (ref 8.6–10.3)
Chloride: 104 mmol/L (ref 98–110)
Creat: 0.97 mg/dL (ref 0.70–1.25)
Globulin: 2.4 g/dL (calc) (ref 1.9–3.7)
Glucose, Bld: 114 mg/dL — ABNORMAL HIGH (ref 65–99)
Potassium: 4.1 mmol/L (ref 3.5–5.3)
Sodium: 137 mmol/L (ref 135–146)
Total Bilirubin: 0.5 mg/dL (ref 0.2–1.2)
Total Protein: 6.7 g/dL (ref 6.1–8.1)

## 2020-05-16 LAB — HEMOGLOBIN A1C
Hgb A1c MFr Bld: 5.9 % of total Hgb — ABNORMAL HIGH (ref <5.7)
Mean Plasma Glucose: 123 mg/dL
eAG (mmol/L): 6.8 mmol/L

## 2020-05-16 LAB — SEDIMENTATION RATE: Sed Rate: 2 mm/h (ref 0–20)

## 2020-05-18 ENCOUNTER — Encounter: Payer: Self-pay | Admitting: Family Medicine

## 2020-06-01 ENCOUNTER — Telehealth: Payer: Self-pay | Admitting: Family Medicine

## 2020-06-01 NOTE — Telephone Encounter (Signed)
FYI: Pt wants Dr. Doreene Burke to know he is less fatigued

## 2020-06-03 ENCOUNTER — Other Ambulatory Visit: Payer: Self-pay | Admitting: Family Medicine

## 2020-06-03 DIAGNOSIS — F418 Other specified anxiety disorders: Secondary | ICD-10-CM

## 2020-08-07 ENCOUNTER — Other Ambulatory Visit: Payer: Self-pay

## 2020-08-10 ENCOUNTER — Encounter: Payer: Self-pay | Admitting: Family Medicine

## 2020-08-10 ENCOUNTER — Other Ambulatory Visit: Payer: Self-pay

## 2020-08-10 ENCOUNTER — Ambulatory Visit: Payer: 59 | Admitting: Family Medicine

## 2020-08-10 VITALS — BP 132/68 | HR 72 | Temp 98.0°F | Ht 69.0 in | Wt 207.0 lb

## 2020-08-10 DIAGNOSIS — F418 Other specified anxiety disorders: Secondary | ICD-10-CM | POA: Diagnosis not present

## 2020-08-10 DIAGNOSIS — E7800 Pure hypercholesterolemia, unspecified: Secondary | ICD-10-CM | POA: Diagnosis not present

## 2020-08-10 DIAGNOSIS — Z720 Tobacco use: Secondary | ICD-10-CM | POA: Diagnosis not present

## 2020-08-10 DIAGNOSIS — I10 Essential (primary) hypertension: Secondary | ICD-10-CM

## 2020-08-10 MED ORDER — SIMVASTATIN 20 MG PO TABS: 20.0000 mg | ORAL_TABLET | Freq: Every day | ORAL | 3 refills | Status: DC

## 2020-08-10 NOTE — Progress Notes (Signed)
Established Patient Office Visit  Subjective:  Patient ID: Wayne Wheeler, male    DOB: 1955/01/27  Age: 65 y.o. MRN: [id number]  CC:  Chief Complaint  Patient presents with   Follow-up    3 month follow no concerns.     HPI Wayne Wheeler presents for follow-up of hypertension atorvastatin side effect.  Myalgias did clear after discontinuing atorvastatin.  Blood pressure is improved on the higher dose of lisinopril denies cough or any issues taking a higher dose.  Lexapro continues to work well for him.  His sleeping is improved.  Planning on starting Medicare soon.  Continues to work some on a as needed basis.  Past Medical History:  Diagnosis Date   Depression     Past Surgical History:  Procedure Laterality Date   SHOULDER SURGERY Right 06/10/2009    Family History  Problem Relation Age of Onset   Depression Mother     Social History   Socioeconomic History   Marital status: Unknown    Spouse name: Not on file   Number of children: Not on file   Years of education: Not on file   Highest education level: Not on file  Occupational History   Not on file  Tobacco Use   Smoking status: Some Days    Packs/day: 1.00    Types: Cigarettes   Smokeless tobacco: Never  Vaping Use   Vaping Use: Never used  Substance and Sexual Activity   Alcohol use: Yes    Alcohol/week: 1.0 standard drink    Types: 1 Cans of beer per week    Comment: social   Drug use: Never   Sexual activity: Not on file  Other Topics Concern   Not on file  Social History Narrative   Not on file   Social Determinants of Health   Financial Resource Strain: Not on file  Food Insecurity: Not on file  Transportation Needs: Not on file  Physical Activity: Not on file  Stress: Not on file  Social Connections: Not on file  Intimate Partner Violence: Not on file    Outpatient Medications Prior to Visit  Medication Sig Dispense Refill   escitalopram (LEXAPRO) 20 MG tablet TAKE ONE TABLET BY MOUTH  DAILY 90 tablet 0   hydrochlorothiazide (HYDRODIURIL) 25 MG tablet Take 1 tablet (25 mg total) by mouth daily. 90 tablet 3   lisinopril (ZESTRIL) 40 MG tablet Take 1 tablet (40 mg total) by mouth daily. 90 tablet 3   atorvastatin (LIPITOR) 20 MG tablet Take 1 tablet (20 mg total) by mouth daily. 90 tablet 3   No facility-administered medications prior to visit.    Allergies  Allergen Reactions   Prednisone Other (See Comments)    Dyspepsia    ROS Review of Systems  Constitutional: Negative.   Respiratory:  Negative for cough, chest tightness and wheezing.   Cardiovascular: Negative.   Gastrointestinal: Negative.   Musculoskeletal:  Negative for myalgias.  Neurological:  Negative for speech difficulty and weakness.  Psychiatric/Behavioral: Negative.       Objective:    Physical Exam Vitals and nursing note reviewed.  Constitutional:      Appearance: Normal appearance.  HENT:     Head: Normocephalic and atraumatic.  Neck:     Vascular: No carotid bruit.  Cardiovascular:     Rate and Rhythm: Normal rate and regular rhythm.     Pulses: Normal pulses.     Heart sounds: Normal heart sounds.  Musculoskeletal:  Cervical back: No rigidity or tenderness.     Right lower leg: No edema.     Left lower leg: No edema.  Lymphadenopathy:     Cervical: No cervical adenopathy.  Neurological:     Mental Status: He is alert and oriented to person, place, and time.  Psychiatric:        Mood and Affect: Mood normal.    BP 132/68 (BP Location: Left Arm, Patient Position: Sitting, Cuff Size: Normal)   Pulse 72   Temp 98 F (36.7 C) (Temporal)   Ht 5\' 9"  (1.753 m)   Wt 207 lb (93.9 kg)   SpO2 98%   BMI 30.57 kg/m  Wt Readings from Last 3 Encounters:  08/10/20 207 lb (93.9 kg)  05/15/20 207 lb 6.4 oz (94.1 kg)  03/06/20 207 lb (93.9 kg)     Health Maintenance Due  Topic Date Due   Pneumococcal Vaccine 68-37 Years old (1 - PCV) Never done   HIV Screening  Never done    Hepatitis C Screening  Never done   Zoster Vaccines- Shingrix (1 of 2) Never done   TETANUS/TDAP  02/02/2020   COLONOSCOPY (Pts 45-16yrs Insurance coverage will need to be confirmed)  07/09/2020   INFLUENZA VACCINE  08/10/2020    There are no preventive care reminders to display for this patient.  No results found for: TSH Lab Results  Component Value Date   WBC 7.0 05/15/2020   HGB 14.6 05/15/2020   HCT 41.2 05/15/2020   MCV 96.5 05/15/2020   PLT 158 05/15/2020   Lab Results  Component Value Date   NA 137 05/15/2020   K 4.1 05/15/2020   CO2 24 05/15/2020   GLUCOSE 114 (H) 05/15/2020   BUN 12 05/15/2020   CREATININE 0.97 05/15/2020   BILITOT 0.5 05/15/2020   ALKPHOS 68 09/25/2019   AST 20 05/15/2020   ALT 30 05/15/2020   PROT 6.7 05/15/2020   ALBUMIN 4.4 09/25/2019   CALCIUM 9.4 05/15/2020   GFR 76.10 09/25/2019   Lab Results  Component Value Date   CHOL 123 05/15/2020   Lab Results  Component Value Date   HDL 38 (L) 05/15/2020   Lab Results  Component Value Date   LDLCALC 66 05/15/2020   Lab Results  Component Value Date   TRIG 111 05/15/2020   Lab Results  Component Value Date   CHOLHDL 3.2 05/15/2020   Lab Results  Component Value Date   HGBA1C 5.9 (H) 05/15/2020      Assessment & Plan:   Problem List Items Addressed This Visit       Cardiovascular and Mediastinum   Essential hypertension - Primary   Relevant Medications   simvastatin (ZOCOR) 20 MG tablet   Other Relevant Orders   Basic metabolic panel     Other   Tobacco use   Depression with anxiety   Elevated LDL cholesterol level   Relevant Medications   simvastatin (ZOCOR) 20 MG tablet    Meds ordered this encounter  Medications   simvastatin (ZOCOR) 20 MG tablet    Sig: Take 1 tablet (20 mg total) by mouth at bedtime.    Dispense:  90 tablet    Refill:  3     Follow-up: Return in about 6 months (around 02/10/2021), or let me know if you have any problems with zocor..   Continue all medicines follow-up in 6 months.  04/10/2021, MD

## 2020-08-11 LAB — BASIC METABOLIC PANEL
BUN: 16 mg/dL (ref 6–23)
CO2: 26 mEq/L (ref 19–32)
Calcium: 9.3 mg/dL (ref 8.4–10.5)
Chloride: 101 mEq/L (ref 96–112)
Creatinine, Ser: 0.98 mg/dL (ref 0.40–1.50)
GFR: 81.26 mL/min (ref >60.00)
Glucose, Bld: 114 mg/dL — ABNORMAL HIGH (ref 70–99)
Potassium: 4.3 mEq/L (ref 3.5–5.1)
Sodium: 135 mEq/L (ref 135–145)

## 2020-09-07 ENCOUNTER — Other Ambulatory Visit: Payer: Self-pay

## 2020-09-07 DIAGNOSIS — F418 Other specified anxiety disorders: Secondary | ICD-10-CM

## 2020-09-07 MED ORDER — ESCITALOPRAM OXALATE 20 MG PO TABS: 20.0000 mg | ORAL_TABLET | Freq: Every day | ORAL | 1 refills | Status: DC

## 2020-09-07 NOTE — Telephone Encounter (Signed)
Pt called checking in on his refill.Wayne KitchenMarland Wheeler

## 2020-09-07 NOTE — Telephone Encounter (Signed)
Refill request for pending Rx last OV 08/10/20 last refill May 2022. Please advise.

## 2020-09-08 NOTE — Telephone Encounter (Signed)
Patient notified VIA phone.  No questions.  Dm/cma ? ?

## 2021-03-01 ENCOUNTER — Other Ambulatory Visit: Payer: Self-pay

## 2021-03-02 ENCOUNTER — Encounter: Payer: Self-pay | Admitting: Family Medicine

## 2021-03-02 ENCOUNTER — Ambulatory Visit (INDEPENDENT_AMBULATORY_CARE_PROVIDER_SITE_OTHER): Payer: No Typology Code available for payment source | Admitting: Family Medicine

## 2021-03-02 ENCOUNTER — Ambulatory Visit (INDEPENDENT_AMBULATORY_CARE_PROVIDER_SITE_OTHER): Payer: No Typology Code available for payment source

## 2021-03-02 VITALS — BP 132/70 | HR 69 | Temp 98.2°F | Ht 69.0 in | Wt 205.8 lb

## 2021-03-02 DIAGNOSIS — R079 Chest pain, unspecified: Secondary | ICD-10-CM | POA: Diagnosis not present

## 2021-03-02 DIAGNOSIS — R251 Tremor, unspecified: Secondary | ICD-10-CM | POA: Diagnosis not present

## 2021-03-02 DIAGNOSIS — Z125 Encounter for screening for malignant neoplasm of prostate: Secondary | ICD-10-CM

## 2021-03-02 DIAGNOSIS — M5412 Radiculopathy, cervical region: Secondary | ICD-10-CM | POA: Diagnosis not present

## 2021-03-02 DIAGNOSIS — Z1211 Encounter for screening for malignant neoplasm of colon: Secondary | ICD-10-CM

## 2021-03-02 DIAGNOSIS — M25511 Pain in right shoulder: Secondary | ICD-10-CM

## 2021-03-02 DIAGNOSIS — R1011 Right upper quadrant pain: Secondary | ICD-10-CM | POA: Insufficient documentation

## 2021-03-02 DIAGNOSIS — F418 Other specified anxiety disorders: Secondary | ICD-10-CM

## 2021-03-02 DIAGNOSIS — Z23 Encounter for immunization: Secondary | ICD-10-CM

## 2021-03-02 DIAGNOSIS — Z720 Tobacco use: Secondary | ICD-10-CM

## 2021-03-02 DIAGNOSIS — R059 Cough, unspecified: Secondary | ICD-10-CM | POA: Diagnosis not present

## 2021-03-02 DIAGNOSIS — I10 Essential (primary) hypertension: Secondary | ICD-10-CM | POA: Diagnosis not present

## 2021-03-02 DIAGNOSIS — K760 Fatty (change of) liver, not elsewhere classified: Secondary | ICD-10-CM | POA: Diagnosis not present

## 2021-03-02 DIAGNOSIS — E7800 Pure hypercholesterolemia, unspecified: Secondary | ICD-10-CM

## 2021-03-02 DIAGNOSIS — M47812 Spondylosis without myelopathy or radiculopathy, cervical region: Secondary | ICD-10-CM | POA: Diagnosis not present

## 2021-03-02 LAB — LIPID PANEL
Cholesterol: 184 mg/dL (ref 0–200)
HDL: 36.4 mg/dL — ABNORMAL LOW (ref >39.00)
NonHDL: 147.98
Total CHOL/HDL Ratio: 5
Triglycerides: 209 mg/dL — ABNORMAL HIGH (ref 0.0–149.0)
VLDL: 41.8 mg/dL — ABNORMAL HIGH (ref 0.0–40.0)

## 2021-03-02 LAB — PSA: PSA: 0.31 ng/mL (ref 0.10–4.00)

## 2021-03-02 LAB — CBC
HCT: 43.9 % (ref 39.0–52.0)
Hemoglobin: 15 g/dL (ref 13.0–17.0)
MCHC: 34.2 g/dL (ref 30.0–36.0)
MCV: 95.9 fl (ref 78.0–100.0)
Platelets: 176 10*3/uL (ref 150.0–400.0)
RBC: 4.58 Mil/uL (ref 4.22–5.81)
RDW: 13.1 % (ref 11.5–15.5)
WBC: 9.2 10*3/uL (ref 4.0–10.5)

## 2021-03-02 LAB — COMPREHENSIVE METABOLIC PANEL
ALT: 32 U/L (ref 0–53)
AST: 18 U/L (ref 0–37)
Albumin: 4.5 g/dL (ref 3.5–5.2)
Alkaline Phosphatase: 62 U/L (ref 39–117)
BUN: 16 mg/dL (ref 6–23)
CO2: 27 mEq/L (ref 19–32)
Calcium: 9.7 mg/dL (ref 8.4–10.5)
Chloride: 99 mEq/L (ref 96–112)
Creatinine, Ser: 0.93 mg/dL (ref 0.40–1.50)
GFR: 86.19 mL/min (ref >60.00)
Glucose, Bld: 147 mg/dL — ABNORMAL HIGH (ref 70–99)
Potassium: 4.2 mEq/L (ref 3.5–5.1)
Sodium: 132 mEq/L — ABNORMAL LOW (ref 135–145)
Total Bilirubin: 0.5 mg/dL (ref 0.2–1.2)
Total Protein: 6.8 g/dL (ref 6.0–8.3)

## 2021-03-02 LAB — AMYLASE: Amylase: 67 U/L (ref 27–131)

## 2021-03-02 LAB — LDL CHOLESTEROL, DIRECT: Direct LDL: 123 mg/dL

## 2021-03-02 NOTE — Progress Notes (Addendum)
Established Patient Office Visit  Subjective:  Patient ID: Wayne Wheeler, male    DOB: Sep 16, 1955  Age: 66 y.o. MRN: [id number]  CC:  Chief Complaint  Patient presents with   Follow-up    6 month follow up, concerns about right arm pains with some tingling, right armpit vert tender to touch symptoms x 3-4 month.     HPI Wayne Wheeler presents for follow-up of hypertension, hyperlipidemia, right shoulder pain.  The pain in his right shoulder is located specifically in his axilla.  There is pain radiating down the right arm with numbness and tingling in his fourth and fifth fingers.  Neck does not seem to be bothering him much.  Full strength.  He is right-hand dominant.  No injury.  Continues to smoke.  Blood pressure controlled with lisinopril and HCTZ.  Continue simvastatin for elevated LDL cholesterol.  Past Medical History:  Diagnosis Date   Depression     Past Surgical History:  Procedure Laterality Date   SHOULDER SURGERY Right 06/10/2009    Family History  Problem Relation Age of Onset   Depression Mother     Social History   Socioeconomic History   Marital status: Unknown    Spouse name: Not on file   Number of children: Not on file   Years of education: Not on file   Highest education level: Not on file  Occupational History   Not on file  Tobacco Use   Smoking status: Some Days    Packs/day: 1.00    Types: Cigarettes   Smokeless tobacco: Never  Vaping Use   Vaping Use: Never used  Substance and Sexual Activity   Alcohol use: Yes    Alcohol/week: 1.0 standard drink    Types: 1 Cans of beer per week    Comment: social   Drug use: Never   Sexual activity: Not on file  Other Topics Concern   Not on file  Social History Narrative   Not on file   Social Determinants of Health   Financial Resource Strain: Not on file  Food Insecurity: Not on file  Transportation Needs: Not on file  Physical Activity: Not on file  Stress: Not on file  Social  Connections: Not on file  Intimate Partner Violence: Not on file    Outpatient Medications Prior to Visit  Medication Sig Dispense Refill   lisinopril (ZESTRIL) 40 MG tablet Take 1 tablet (40 mg total) by mouth daily. 90 tablet 3   escitalopram (LEXAPRO) 20 MG tablet Take 1 tablet (20 mg total) by mouth daily. 90 tablet 1   hydrochlorothiazide (HYDRODIURIL) 25 MG tablet Take 1 tablet (25 mg total) by mouth daily. 90 tablet 3   simvastatin (ZOCOR) 20 MG tablet Take 1 tablet (20 mg total) by mouth at bedtime. 90 tablet 3   No facility-administered medications prior to visit.    Allergies  Allergen Reactions   Prednisone Other (See Comments)    Dyspepsia    ROS Review of Systems  Constitutional:  Negative for diaphoresis, fatigue, fever and unexpected weight change.  HENT: Negative.    Eyes:  Negative for photophobia and visual disturbance.  Respiratory:  Negative for chest tightness, shortness of breath and wheezing.   Cardiovascular:  Negative for chest pain and palpitations.  Gastrointestinal:  Negative for abdominal pain, blood in stool, constipation, nausea and vomiting.  Endocrine: Negative for polyphagia and polyuria.  Genitourinary:  Negative for decreased urine volume, dysuria and hematuria.  Musculoskeletal:  Positive for arthralgias.  Negative for neck pain and neck stiffness.  Neurological:  Positive for numbness. Negative for speech difficulty and weakness.  Psychiatric/Behavioral: Negative.       Objective:    Physical Exam Vitals and nursing note reviewed.  Constitutional:      General: He is not in acute distress.    Appearance: Normal appearance. He is not ill-appearing, toxic-appearing or diaphoretic.  HENT:     Head: Normocephalic and atraumatic.     Right Ear: Tympanic membrane, ear canal and external ear normal.     Left Ear: Tympanic membrane, ear canal and external ear normal.     Mouth/Throat:     Mouth: Mucous membranes are moist.     Pharynx:  Oropharynx is clear. No oropharyngeal exudate.  Eyes:     General: No scleral icterus.       Right eye: No discharge.        Left eye: No discharge.     Extraocular Movements: Extraocular movements intact.     Conjunctiva/sclera: Conjunctivae normal.     Pupils: Pupils are equal, round, and reactive to light.  Cardiovascular:     Rate and Rhythm: Normal rate and regular rhythm.  Pulmonary:     Effort: Pulmonary effort is normal.     Breath sounds: Decreased air movement present. Decreased breath sounds present.  Abdominal:     General: Bowel sounds are normal.     Tenderness: There is abdominal tenderness in the left upper quadrant. There is no guarding or rebound.     Comments: Mild tenderness to palpation in left upper quadrant.  Musculoskeletal:     Cervical back: No rigidity, spasms, tenderness or bony tenderness. No pain with movement. Normal range of motion.       Back:  Lymphadenopathy:     Cervical: No cervical adenopathy.     Upper Body:     Right upper body: No axillary adenopathy.     Comments: Palpation of the left axilla finds no lesions or masses.  Skin:    General: Skin is warm and dry.  Neurological:     Mental Status: He is alert and oriented to person, place, and time.     Motor: Tremor present. No weakness.     Comments: Tremor of head noted.  Psychiatric:        Mood and Affect: Mood normal.        Behavior: Behavior normal.    BP 132/70 (BP Location: Left Arm, Patient Position: Sitting, Cuff Size: Large)    Pulse 69    Temp 98.2 F (36.8 C) (Temporal)    Ht 5\' 9"  (1.753 m)    Wt 205 lb 12.8 oz (93.4 kg)    SpO2 94%    BMI 30.39 kg/m  Wt Readings from Last 3 Encounters:  03/02/21 205 lb 12.8 oz (93.4 kg)  08/10/20 207 lb (93.9 kg)  05/15/20 207 lb 6.4 oz (94.1 kg)     Health Maintenance Due  Topic Date Due   HIV Screening  Never done   Hepatitis C Screening  Never done   Zoster Vaccines- Shingrix (1 of 2) Never done   INFLUENZA VACCINE   08/10/2020    There are no preventive care reminders to display for this patient.  No results found for: TSH Lab Results  Component Value Date   WBC 9.2 03/02/2021   HGB 15.0 03/02/2021   HCT 43.9 03/02/2021   MCV 95.9 03/02/2021   PLT 176.0 03/02/2021   Lab Results  Component Value Date   NA 132 (L) 03/02/2021   K 4.2 03/02/2021   CO2 27 03/02/2021   GLUCOSE 147 (H) 03/02/2021   BUN 16 03/02/2021   CREATININE 0.93 03/02/2021   BILITOT 0.5 03/02/2021   ALKPHOS 62 03/02/2021   AST 18 03/02/2021   ALT 32 03/02/2021   PROT 6.8 03/02/2021   ALBUMIN 4.5 03/02/2021   CALCIUM 9.7 03/02/2021   GFR 86.19 03/02/2021   Lab Results  Component Value Date   CHOL 184 03/02/2021   Lab Results  Component Value Date   HDL 36.40 (L) 03/02/2021   Lab Results  Component Value Date   LDLCALC 66 05/15/2020   Lab Results  Component Value Date   TRIG 209.0 (H) 03/02/2021   Lab Results  Component Value Date   CHOLHDL 5 03/02/2021   Lab Results  Component Value Date   HGBA1C 5.9 (H) 05/15/2020      Assessment & Plan:   Problem List Items Addressed This Visit       Cardiovascular and Mediastinum   Essential hypertension   Relevant Medications   simvastatin (ZOCOR) 40 MG tablet   Other Relevant Orders   CBC (Completed)   Comprehensive metabolic panel (Completed)   Urinalysis, Routine w reflex microscopic     Digestive   Hepatic steatosis   Relevant Orders   HIV Antibody (routine testing w rflx)   Hepatitis C antibody   Hepatitis B Surface AntiGEN     Nervous and Auditory   Cervical radiculopathy   Relevant Orders   Ambulatory referral to Neurology   DG Cervical Spine Complete (Completed)     Other   Tobacco use   Relevant Orders   DG Chest 2 View (Completed)   Depression with anxiety   Encounter for screening colonoscopy - Primary   Relevant Orders   Ambulatory referral to Gastroenterology   Elevated LDL cholesterol level   Relevant Medications    simvastatin (ZOCOR) 40 MG tablet   Other Relevant Orders   Comprehensive metabolic panel (Completed)   LDL cholesterol, direct (Completed)   Lipid panel (Completed)   Tremor observed on examination   Relevant Orders   Ambulatory referral to Neurology   Right shoulder pain   Relevant Orders   DG Chest 2 View (Completed)   Right upper quadrant abdominal pain   Relevant Orders   Comprehensive metabolic panel (Completed)   US Abdomen Complete (Completed)   Amylase (Completed)   Need for vaccination against Streptococcus pneumoniae   Relevant Orders   Pneumococcal conjugate vaccine 20-valent (Prevnar 20) (Completed)   Need for Tdap vaccination   Relevant Orders   Tdap vaccine greater than or equal to 7yo IM (Completed)    Meds ordered this encounter  Medications   simvastatin (ZOCOR) 40 MG tablet    Sig: Take 1 tablet (40 mg total) by mouth at bedtime.    Dispense:  90 tablet    Refill:  3    Follow-up: Return in about 3 months (around 05/30/2021).  Concerned about right shoulder pain being possibly referred from the chest and/or the abdomen.  Checking chest x-ray and right upper quadrant ultrasound.  Will check films of the neck.  Libby Maw, MD

## 2021-03-08 MED ORDER — SIMVASTATIN 40 MG PO TABS: 40.0000 mg | ORAL_TABLET | Freq: Every day | ORAL | 3 refills | Status: DC

## 2021-03-08 NOTE — Progress Notes (Addendum)
LDL cholesterol needs to be lower.  Have increase Zocor to 40 mg daily.  Blood sugar was elevated.  Please try to exercise and lose some weight and we will follow this up in 3 months.

## 2021-03-15 ENCOUNTER — Other Ambulatory Visit: Payer: Self-pay | Admitting: Family Medicine

## 2021-03-15 ENCOUNTER — Ambulatory Visit (HOSPITAL_BASED_OUTPATIENT_CLINIC_OR_DEPARTMENT_OTHER)
Admission: RE | Admit: 2021-03-15 | Discharge: 2021-03-15 | Disposition: A | Discharge status: Home / Self Care | Payer: No Typology Code available for payment source | Source: Ambulatory Visit | Attending: Family Medicine | Admitting: Family Medicine

## 2021-03-15 ENCOUNTER — Other Ambulatory Visit: Payer: Self-pay

## 2021-03-15 DIAGNOSIS — R1011 Right upper quadrant pain: Secondary | ICD-10-CM

## 2021-03-15 DIAGNOSIS — I10 Essential (primary) hypertension: Secondary | ICD-10-CM

## 2021-03-15 DIAGNOSIS — F418 Other specified anxiety disorders: Secondary | ICD-10-CM

## 2021-03-15 MED ORDER — HYDROCHLOROTHIAZIDE 25 MG PO TABS: 25.0000 mg | ORAL_TABLET | Freq: Every day | ORAL | 3 refills | Status: DC

## 2021-03-15 MED ORDER — ESCITALOPRAM OXALATE 20 MG PO TABS: 20.0000 mg | ORAL_TABLET | Freq: Every day | ORAL | 1 refills | Status: DC

## 2021-03-15 NOTE — Telephone Encounter (Signed)
Refill request for pending Rx last OV 03/02/21 last refill 09/07/20. Please advise ? ?

## 2021-03-15 NOTE — Addendum Note (Signed)
Addended by: Lynda Rainwater on: 03/15/2021 03:49 PM ? ? Modules accepted: Orders ? ?

## 2021-03-15 NOTE — Telephone Encounter (Signed)
Pt went to pharmacy and they told him 2 of his meds were not authorized. Escitalopram and hydrochlorothiazide had not been authorized. He is completely out. ?

## 2021-03-18 DIAGNOSIS — K760 Fatty (change of) liver, not elsewhere classified: Secondary | ICD-10-CM | POA: Insufficient documentation

## 2021-03-18 NOTE — Addendum Note (Signed)
Addended by: Andrez Grime on: 03/18/2021 04:33 PM   Modules accepted: Orders

## 2021-03-26 ENCOUNTER — Other Ambulatory Visit: Payer: Self-pay

## 2021-03-26 ENCOUNTER — Other Ambulatory Visit (INDEPENDENT_AMBULATORY_CARE_PROVIDER_SITE_OTHER): Payer: No Typology Code available for payment source

## 2021-03-26 DIAGNOSIS — I10 Essential (primary) hypertension: Secondary | ICD-10-CM

## 2021-03-26 DIAGNOSIS — K760 Fatty (change of) liver, not elsewhere classified: Secondary | ICD-10-CM

## 2021-03-26 LAB — URINALYSIS, ROUTINE W REFLEX MICROSCOPIC
Bilirubin Urine: NEGATIVE
Hgb urine dipstick: NEGATIVE
Ketones, ur: NEGATIVE
Leukocytes,Ua: NEGATIVE
Nitrite: NEGATIVE
Specific Gravity, Urine: 1.02 (ref 1.000–1.030)
Total Protein, Urine: NEGATIVE
Urine Glucose: NEGATIVE
Urobilinogen, UA: 0.2 (ref 0.0–1.0)
pH: 5.5 (ref 5.0–8.0)

## 2021-03-26 NOTE — Addendum Note (Signed)
Addended by: Beryle Lathe S on: 03/26/2021 01:26 PM ? ? Modules accepted: Orders ? ?

## 2021-03-26 NOTE — Progress Notes (Signed)
Per the orders of Dr. Kremer pt is here for labs pt tolerated draw well.  

## 2021-03-29 LAB — HEPATITIS C ANTIBODY
Hepatitis C Ab: NONREACTIVE
SIGNAL TO CUT-OFF: <0.02 (ref <1.00)

## 2021-03-29 LAB — HIV ANTIBODY (ROUTINE TESTING W REFLEX): HIV 1&2 Ab, 4th Generation: NONREACTIVE

## 2021-03-29 LAB — HEPATITIS B SURFACE ANTIGEN: Hepatitis B Surface Ag: NONREACTIVE

## 2021-06-14 ENCOUNTER — Other Ambulatory Visit: Payer: Self-pay | Admitting: Family Medicine

## 2021-06-14 DIAGNOSIS — I10 Essential (primary) hypertension: Secondary | ICD-10-CM

## 2021-09-01 ENCOUNTER — Telehealth: Payer: Self-pay | Admitting: Family Medicine

## 2021-09-01 NOTE — Telephone Encounter (Signed)
Left message for patient to call back and schedule Medicare Annual Wellness Visit (AWV).   Please offer to do virtually or by telephone.  Left office number and my jabber #[phone].  AWVI eligible as of  09/10/21  Please schedule at anytime with Nurse Health Advisor.   

## 2021-09-08 ENCOUNTER — Other Ambulatory Visit: Payer: Self-pay | Admitting: Family Medicine

## 2021-09-08 DIAGNOSIS — F418 Other specified anxiety disorders: Secondary | ICD-10-CM

## 2021-09-08 DIAGNOSIS — I10 Essential (primary) hypertension: Secondary | ICD-10-CM

## 2021-09-14 ENCOUNTER — Ambulatory Visit (INDEPENDENT_AMBULATORY_CARE_PROVIDER_SITE_OTHER): Payer: No Typology Code available for payment source

## 2021-09-14 VITALS — Ht 68.0 in | Wt 205.0 lb

## 2021-09-14 DIAGNOSIS — Z0000 Encounter for general adult medical examination without abnormal findings: Secondary | ICD-10-CM | POA: Diagnosis not present

## 2021-09-14 DIAGNOSIS — Z1211 Encounter for screening for malignant neoplasm of colon: Secondary | ICD-10-CM

## 2021-09-14 NOTE — Progress Notes (Signed)
Subjective:   Rayshawn Rosato is a 66 y.o. male who presents for Medicare Annual/Subsequent preventive examination.  Virtual Visit via Telephone Note  I connected with  Posey Boyer on 09/14/21 at  2:30 PM EDT by telephone and verified that I am speaking with the correct person using two identifiers.  Location: Patient: home  Provider: [facility]  Persons participating in the virtual visit: patient/Nurse Health Advisor   I discussed the limitations, risks, security and privacy concerns of performing an evaluation and management service by telephone and the availability of in person appointments. The patient expressed understanding and agreed to proceed.  Interactive audio and video telecommunications were attempted between this nurse and patient, however failed, due to patient having technical difficulties OR patient did not have access to video capability.  We continued and completed visit with audio only.  Some vital signs may be absent or patient reported.   Daphane Shepherd, LPN  Review of Systems     Cardiac Risk Factors include: advanced age (>20men, >84 women)     Objective:    Today's Vitals   09/14/21 1433  Weight: 205 lb (93 kg)  Height: 5\' 8"  (1.727 m)   Body mass index is 31.17 kg/m.     09/14/2021    2:40 PM  Advanced Directives  Does Patient Have a Medical Advance Directive? No  Would patient like information on creating a medical advance directive? No - Patient declined    Current Medications (verified) Outpatient Encounter Medications as of 09/14/2021  Medication Sig   cyanocobalamin (VITAMIN B12) 500 MCG tablet Take 500 mcg by mouth daily.   escitalopram (LEXAPRO) 20 MG tablet Take 1 tablet by mouth once daily   hydrochlorothiazide (HYDRODIURIL) 25 MG tablet Take 1 tablet (25 mg total) by mouth daily.   lisinopril (ZESTRIL) 40 MG tablet Take 1 tablet by mouth once daily   simvastatin (ZOCOR) 40 MG tablet Take 1 tablet (40 mg total) by mouth at bedtime.   No  facility-administered encounter medications on file as of 09/14/2021.    Allergies (verified) Prednisone   History: Past Medical History:  Diagnosis Date   Depression    Past Surgical History:  Procedure Laterality Date   SHOULDER SURGERY Right 06/10/2009   Family History  Problem Relation Age of Onset   Depression Mother    Social History   Socioeconomic History   Marital status: Unknown    Spouse name: Not on file   Number of children: Not on file   Years of education: Not on file   Highest education level: Not on file  Occupational History   Not on file  Tobacco Use   Smoking status: Some Days    Packs/day: 1.00    Types: Cigarettes   Smokeless tobacco: Never  Vaping Use   Vaping Use: Never used  Substance and Sexual Activity   Alcohol use: Yes    Alcohol/week: 1.0 standard drink of alcohol    Types: 1 Cans of beer per week    Comment: social   Drug use: Never   Sexual activity: Not on file  Other Topics Concern   Not on file  Social History Narrative   Not on file   Social Determinants of Health   Financial Resource Strain: Low Risk  (09/14/2021)   Overall Financial Resource Strain (CARDIA)    Difficulty of Paying Living Expenses: Not hard at all  Food Insecurity: No Food Insecurity (09/14/2021)   Hunger Vital Sign    Worried About  Running Out of Food in the Last Year: Never true    [place] in the Last Year: Never true  Transportation Needs: No Transportation Needs (09/14/2021)   PRAPARE - Hydrologist (Medical): No    Lack of Transportation (Non-Medical): No  Physical Activity: Sufficiently Active (09/14/2021)   Exercise Vital Sign    Days of Exercise per Week: 7 days    Minutes of Exercise per Session: 30 min  Stress: No Stress Concern Present (09/14/2021)   [place]    Feeling of Stress : Not at all  Social Connections: [place] (09/14/2021)    Social Connection and Isolation Panel [NHANES]    Frequency of Communication with Friends and Family: More than three times a week    Frequency of Social Gatherings with Friends and Family: More than three times a week    Attends Religious Services: More than 4 times per year    Active Member of [employer] or Organizations: Yes    Attends Music therapist: More than 4 times per year    Marital Status: Married    Tobacco Counseling Ready to quit: Not Answered Counseling given: Not Answered   Clinical Intake:  Pre-visit preparation completed: Yes  Pain : No/denies pain     Nutritional Risks: None Diabetes: No  How often do you need to have someone help you when you read instructions, pamphlets, or other written materials from your doctor or pharmacy?: 1 - Never  Diabetic?no   Interpreter Needed?: No  Information entered by :: Jadene Pierini , LPN   Activities of Daily Living    09/14/2021    2:40 PM  In your present state of health, do you have any difficulty performing the following activities:  Hearing? 0  Vision? 0  Difficulty concentrating or making decisions? 0  Walking or climbing stairs? 0  Dressing or bathing? 0  Doing errands, shopping? 0  Preparing Food and eating ? N  Using the Toilet? N  In the past six months, have you accidently leaked urine? N  Do you have problems with loss of bowel control? N  Managing your Medications? N  Managing your Finances? N  Housekeeping or managing your Housekeeping? N    Patient Care Team: Libby Maw, MD as PCP - General (Family Medicine)  Indicate any recent Medical Services you may have received from other than [facility] providers in the past year (date may be approximate).     Assessment:   This is a routine wellness examination for Manfred.  Hearing/Vision screen Vision Screening - Comments:: Due Annual  ey exam wears glasses   Dietary issues and exercise activities discussed: Current Exercise  Habits: Home exercise routine, Time (Minutes): 30, Frequency (Times/Week): 7, Weekly Exercise (Minutes/Week): 210, Intensity: Mild, Exercise limited by: None identified   Goals Addressed             This Visit's Progress    Exercise 3x per week (30 min per time)         Depression Screen    09/14/2021    2:38 PM 03/02/2021   11:02 AM 08/10/2020    2:07 PM 05/15/2020    2:43 PM 03/06/2020    1:40 PM 06/13/2019    2:08 PM 05/13/2019   11:20 AM  PHQ 2/9 Scores  PHQ - 2 Score 0 0 0 0 3 2 4   PHQ- 9 Score  11 8 12     Fall Risk    09/14/2021    2:35 PM 03/02/2021   11:02 AM 08/10/2020    2:07 PM 05/15/2020    2:42 PM 05/13/2019   11:01 AM  Fall Risk   Falls in the past year? 0 0 0 0 0  Number falls in past yr: 0 0     Injury with Fall? 0      Risk for fall due to : No Fall Risks      Follow up Falls prevention discussed        FALL RISK PREVENTION PERTAINING TO THE HOME:  Any stairs in or around the home? Yes  If so, are there any without handrails? No  Home free of loose throw rugs in walkways, pet beds, electrical cords, etc? Yes  Adequate lighting in your home to reduce risk of falls? Yes   ASSISTIVE DEVICES UTILIZED TO PREVENT FALLS:  Life alert? No  Use of a cane, walker or w/c? No  Grab bars in the bathroom? No  Shower chair or bench in shower? No  Elevated toilet seat or a handicapped toilet? No          09/14/2021    2:41 PM  6CIT Screen  What Year? 0 points  What month? 0 points  What time? 0 points  Count back from 20 0 points  Months in reverse 0 points  Repeat phrase 0 points  Total Score 0 points    Immunizations Immunization History  Administered Date(s) Administered   Influenza,inj,Quad PF,6+ Mos 09/25/2019   Influenza-Unspecified 10/19/2012   Moderna Sars-Covid-2 Vaccination 05/25/2019, 07/12/2019   PNEUMOCOCCAL CONJUGATE-20 03/02/2021   Tdap 02/01/2010, 03/02/2021    TDAP status: Up to date  Flu Vaccine status: Up to date  Pneumococcal  vaccine status: Up to date  Covid-19 vaccine status: Completed vaccines  Qualifies for Shingles Vaccine? Yes   Zostavax completed No   Shingrix Completed?: No.    Education has been provided regarding the importance of this vaccine. Patient has been advised to call insurance company to determine out of pocket expense if they have not yet received this vaccine. Advised may also receive vaccine at local pharmacy or Health Dept. Verbalized acceptance and understanding.  Screening Tests Health Maintenance  Topic Date Due   Zoster Vaccines- Shingrix (1 of 2) Never done   COLONOSCOPY (Pts 45-75yrs Insurance coverage will need to be confirmed)  07/09/2020   INFLUENZA VACCINE  08/10/2021   TETANUS/TDAP  03/03/2031   Pneumonia Vaccine 6+ Years old  Completed   Hepatitis C Screening  Completed   HPV VACCINES  Aged Out   COVID-19 Vaccine  Discontinued    Health Maintenance  Health Maintenance Due  Topic Date Due   Zoster Vaccines- Shingrix (1 of 2) Never done   COLONOSCOPY (Pts 45-82yrs Insurance coverage will need to be confirmed)  07/09/2020   INFLUENZA VACCINE  08/10/2021    Colorectal cancer screening: Referral to GI placed 09/14/2021. Pt aware the office will call re: appt.  Lung Cancer Screening: (Low Dose CT Chest recommended if Age 13-80 years, 30 pack-year currently smoking OR have quit w/in 15years.) does not qualify.   Lung Cancer Screening Referral: n/a  Additional Screening:  Hepatitis C Screening: does not qualify;   Vision Screening: Recommended annual ophthalmology exams for early detection of glaucoma and other disorders of the eye. Is the patient up to date with their annual eye exam?  No  Who is the provider  or what is the name of the office in which the patient attends annual eye exams? [facility]  If pt is not established with a provider, would they like to be referred to a provider to establish care? No .   Dental Screening: Recommended annual dental exams for  proper oral hygiene  Community Resource Referral / Chronic Care Management: CRR required this visit?  No   CCM required this visit?  No      Plan:     I have personally reviewed and noted the following in the patient's chart:   Medical and social history Use of alcohol, tobacco or illicit drugs  Current medications and supplements including opioid prescriptions. Patient is not currently taking opioid prescriptions. Functional ability and status Nutritional status Physical activity Advanced directives List of other physicians Hospitalizations, surgeries, and ER visits in previous 12 months Vitals Screenings to include cognitive, depression, and falls Referrals and appointments  In addition, I have reviewed and discussed with patient certain preventive protocols, quality metrics, and best practice recommendations. A written personalized care plan for preventive services as well as general preventive health recommendations were provided to patient.     Lorrene Reid, LPN   03/17/962   Nurse Notes: Scheduled annual eye exams , and Colonoscopy

## 2021-09-14 NOTE — Patient Instructions (Signed)
Wayne Wheeler , Thank you for taking time to come for your Medicare Wellness Visit. I appreciate your ongoing commitment to your health goals. Please review the following plan we discussed and let me know if I can assist you in the future.   Screening recommendations/referrals: Colonoscopy: referral 09/14/2021 Recommended yearly ophthalmology/optometry visit for glaucoma screening and checkup Recommended yearly dental visit for hygiene and checkup  Vaccinations: Influenza vaccine: completed  Pneumococcal vaccine: completed  Tdap vaccine: 03/02/2021 Shingles vaccine: completed per patient at [facility]    Covid-19:completed   Advanced directives: Advance directive discussed with you today. I have provided a copy for you to complete at home and have notarized. Once this is complete please bring a copy in to our office so we can scan it into your chart.   Conditions/risks identified: Schedule Annual eye exam Aim for 30 minutes of exercise or brisk walking, 6-8 glasses of water, and 5 servings of fruits and vegetables each day.   Next appointment: Follow up in one year for your annual wellness visit.   Preventive Care 3 Years and Older, Male  Preventive care refers to lifestyle choices and visits with your health care provider that can promote health and wellness. What does preventive care include? A yearly physical exam. This is also called an annual well check. Dental exams once or twice a year. Routine eye exams. Ask your health care provider how often you should have your eyes checked. Personal lifestyle choices, including: Daily care of your teeth and gums. Regular physical activity. Eating a healthy diet. Avoiding tobacco and drug use. Limiting alcohol use. Practicing safe sex. Taking low doses of aspirin every day. Taking vitamin and mineral supplements as recommended by your health care provider. What happens during an annual well check? The services and screenings done by your  health care provider during your annual well check will depend on your age, overall health, lifestyle risk factors, and family history of disease. Counseling  Your health care provider may ask you questions about your: Alcohol use. Tobacco use. Drug use. Emotional well-being. Home and relationship well-being. Sexual activity. Eating habits. History of falls. Memory and ability to understand (cognition). Work and work Astronomer. Screening  You may have the following tests or measurements: Height, weight, and BMI. Blood pressure. Lipid and cholesterol levels. These may be checked every 5 years, or more frequently if you are over 63 years old. Skin check. Lung cancer screening. You may have this screening every year starting at age 19 if you have a 30-pack-year history of smoking and currently smoke or have quit within the past 15 years. Fecal occult blood test (FOBT) of the stool. You may have this test every year starting at age 54. Flexible sigmoidoscopy or colonoscopy. You may have a sigmoidoscopy every 5 years or a colonoscopy every 10 years starting at age 43. Prostate cancer screening. Recommendations will vary depending on your family history and other risks. Hepatitis C blood test. Hepatitis B blood test. Sexually transmitted disease (STD) testing. Diabetes screening. This is done by checking your blood sugar (glucose) after you have not eaten for a while (fasting). You may have this done every 1-3 years. Abdominal aortic aneurysm (AAA) screening. You may need this if you are a current or former smoker. Osteoporosis. You may be screened starting at age 74 if you are at high risk. Talk with your health care provider about your test results, treatment options, and if necessary, the need for more tests. Vaccines  Your health care  provider may recommend certain vaccines, such as: Influenza vaccine. This is recommended every year. Tetanus, diphtheria, and acellular pertussis  (Tdap, Td) vaccine. You may need a Td booster every 10 years. Zoster vaccine. You may need this after age 64. Pneumococcal 13-valent conjugate (PCV13) vaccine. One dose is recommended after age 47. Pneumococcal polysaccharide (PPSV23) vaccine. One dose is recommended after age 15. Talk to your health care provider about which screenings and vaccines you need and how often you need them. This information is not intended to replace advice given to you by your health care provider. Make sure you discuss any questions you have with your health care provider. Document Released: 01/23/2015 Document Revised: 09/16/2015 Document Reviewed: 10/28/2014 Elsevier Interactive Patient Education  2017 [place] Prevention in the Home Falls can cause injuries. They can happen to people of all ages. There are many things you can do to make your home safe and to help prevent falls. What can I do on the outside of my home? Regularly fix the edges of walkways and driveways and fix any cracks. Remove anything that might make you trip as you walk through a door, such as a raised step or threshold. Trim any bushes or trees on the path to your home. Use bright outdoor lighting. Clear any walking paths of anything that might make someone trip, such as rocks or tools. Regularly check to see if handrails are loose or broken. Make sure that both sides of any steps have handrails. Any raised decks and porches should have guardrails on the edges. Have any leaves, snow, or ice cleared regularly. Use sand or salt on walking paths during winter. Clean up any spills in your garage right away. This includes oil or grease spills. What can I do in the bathroom? Use night lights. Install grab bars by the toilet and in the tub and shower. Do not use towel bars as grab bars. Use non-skid mats or decals in the tub or shower. If you need to sit down in the shower, use a plastic, non-slip stool. Keep the floor dry. Clean  up any water that spills on the floor as soon as it happens. Remove soap buildup in the tub or shower regularly. Attach bath mats securely with double-sided non-slip rug tape. Do not have throw rugs and other things on the floor that can make you trip. What can I do in the bedroom? Use night lights. Make sure that you have a light by your bed that is easy to reach. Do not use any sheets or blankets that are too big for your bed. They should not hang down onto the floor. Have a firm chair that has side arms. You can use this for support while you get dressed. Do not have throw rugs and other things on the floor that can make you trip. What can I do in the kitchen? Clean up any spills right away. Avoid walking on wet floors. Keep items that you use a lot in easy-to-reach places. If you need to reach something above you, use a strong step stool that has a grab bar. Keep electrical cords out of the way. Do not use floor polish or wax that makes floors slippery. If you must use wax, use non-skid floor wax. Do not have throw rugs and other things on the floor that can make you trip. What can I do with my stairs? Do not leave any items on the stairs. Make sure that there are handrails on both  sides of the stairs and use them. Fix handrails that are broken or loose. Make sure that handrails are as long as the stairways. Check any carpeting to make sure that it is firmly attached to the stairs. Fix any carpet that is loose or worn. Avoid having throw rugs at the top or bottom of the stairs. If you do have throw rugs, attach them to the floor with carpet tape. Make sure that you have a light switch at the top of the stairs and the bottom of the stairs. If you do not have them, ask someone to add them for you. What else can I do to help prevent falls? Wear shoes that: Do not have high heels. Have rubber bottoms. Are comfortable and fit you well. Are closed at the toe. Do not wear sandals. If you  use a stepladder: Make sure that it is fully opened. Do not climb a closed stepladder. Make sure that both sides of the stepladder are locked into place. Ask someone to hold it for you, if possible. Clearly mark and make sure that you can see: Any grab bars or handrails. First and last steps. Where the edge of each step is. Use tools that help you move around (mobility aids) if they are needed. These include: Canes. Walkers. Scooters. Crutches. Turn on the lights when you go into a dark area. Replace any light bulbs as soon as they burn out. Set up your furniture so you have a clear path. Avoid moving your furniture around. If any of your floors are uneven, fix them. If there are any pets around you, be aware of where they are. Review your medicines with your doctor. Some medicines can make you feel dizzy. This can increase your chance of falling. Ask your doctor what other things that you can do to help prevent falls. This information is not intended to replace advice given to you by your health care provider. Make sure you discuss any questions you have with your health care provider. Document Released: 10/23/2008 Document Revised: 06/04/2015 Document Reviewed: 01/31/2014 Elsevier Interactive Patient Education  2017 [employer].

## 2021-11-10 ENCOUNTER — Encounter: Payer: No Typology Code available for payment source | Admitting: Gastroenterology

## 2021-12-16 ENCOUNTER — Other Ambulatory Visit: Payer: Self-pay | Admitting: Family Medicine

## 2021-12-16 DIAGNOSIS — F418 Other specified anxiety disorders: Secondary | ICD-10-CM

## 2021-12-16 DIAGNOSIS — I10 Essential (primary) hypertension: Secondary | ICD-10-CM

## 2022-02-23 NOTE — Progress Notes (Signed)
This encounter was created in error - please disregard.

## 2022-03-28 ENCOUNTER — Other Ambulatory Visit: Payer: Self-pay | Admitting: Family Medicine

## 2022-03-28 DIAGNOSIS — I10 Essential (primary) hypertension: Secondary | ICD-10-CM

## 2022-03-28 DIAGNOSIS — F418 Other specified anxiety disorders: Secondary | ICD-10-CM

## 2022-05-08 ENCOUNTER — Other Ambulatory Visit: Payer: Self-pay | Admitting: Family Medicine

## 2022-05-08 DIAGNOSIS — E7800 Pure hypercholesterolemia, unspecified: Secondary | ICD-10-CM

## 2022-05-31 ENCOUNTER — Other Ambulatory Visit: Payer: Self-pay

## 2022-05-31 DIAGNOSIS — E7800 Pure hypercholesterolemia, unspecified: Secondary | ICD-10-CM

## 2022-05-31 DIAGNOSIS — I10 Essential (primary) hypertension: Secondary | ICD-10-CM

## 2022-05-31 MED ORDER — LISINOPRIL 40 MG PO TABS: 40.0000 mg | ORAL_TABLET | Freq: Every day | ORAL | 0 refills | Status: DC

## 2022-05-31 MED ORDER — SIMVASTATIN 40 MG PO TABS: 40.0000 mg | ORAL_TABLET | Freq: Every day | ORAL | 0 refills | Status: DC

## 2022-05-31 MED ORDER — HYDROCHLOROTHIAZIDE 25 MG PO TABS: 25.0000 mg | ORAL_TABLET | Freq: Every day | ORAL | 0 refills | Status: DC

## 2022-06-21 ENCOUNTER — Other Ambulatory Visit: Payer: Self-pay | Admitting: Family Medicine

## 2022-06-21 DIAGNOSIS — F418 Other specified anxiety disorders: Secondary | ICD-10-CM

## 2022-06-30 ENCOUNTER — Ambulatory Visit (INDEPENDENT_AMBULATORY_CARE_PROVIDER_SITE_OTHER): Payer: Medicare HMO | Admitting: Family Medicine

## 2022-06-30 ENCOUNTER — Encounter: Payer: Self-pay | Admitting: Family Medicine

## 2022-06-30 VITALS — BP 128/78 | HR 75 | Temp 98.8°F | Ht 68.0 in | Wt 216.8 lb

## 2022-06-30 DIAGNOSIS — Z0000 Encounter for general adult medical examination without abnormal findings: Secondary | ICD-10-CM | POA: Diagnosis not present

## 2022-06-30 DIAGNOSIS — Z125 Encounter for screening for malignant neoplasm of prostate: Secondary | ICD-10-CM

## 2022-06-30 DIAGNOSIS — Z1211 Encounter for screening for malignant neoplasm of colon: Secondary | ICD-10-CM

## 2022-06-30 DIAGNOSIS — J449 Chronic obstructive pulmonary disease, unspecified: Secondary | ICD-10-CM

## 2022-06-30 DIAGNOSIS — F418 Other specified anxiety disorders: Secondary | ICD-10-CM

## 2022-06-30 DIAGNOSIS — I10 Essential (primary) hypertension: Secondary | ICD-10-CM

## 2022-06-30 DIAGNOSIS — M5412 Radiculopathy, cervical region: Secondary | ICD-10-CM

## 2022-06-30 DIAGNOSIS — Z720 Tobacco use: Secondary | ICD-10-CM

## 2022-06-30 DIAGNOSIS — R251 Tremor, unspecified: Secondary | ICD-10-CM | POA: Diagnosis not present

## 2022-06-30 DIAGNOSIS — E7800 Pure hypercholesterolemia, unspecified: Secondary | ICD-10-CM

## 2022-06-30 DIAGNOSIS — R002 Palpitations: Secondary | ICD-10-CM

## 2022-06-30 MED ORDER — VARENICLINE TARTRATE (STARTER) 0.5 MG X 11 & 1 MG X 42 PO TBPK
ORAL_TABLET | ORAL | 0 refills | Status: DC
Start: 2022-06-30 — End: 2022-08-08

## 2022-06-30 NOTE — Progress Notes (Addendum)
Established Patient Office Visit   Subjective:  Patient ID: Wayne Wheeler, male    DOB: Nov 08, 1955  Age: 67 y.o. MRN: [id number]  Chief Complaint  Patient presents with   Arm Pain    Right arm, numbness, tingling and pain x 7 months   Medication Refill    Pt states all medication have to be 90 day supply due to change in insurance.     Arm Pain  Associated symptoms include chest pain and tingling.  Medication Refill Associated symptoms include chest pain. Pertinent negatives include no abdominal pain, myalgias, neck pain, rash or weakness.   Encounter Diagnoses  Name Primary?   Tobacco use Yes   Colon cancer screening    Screening for prostate cancer    Depression with anxiety    Cervical radiculopathy    Tremor observed on examination    Elevated LDL cholesterol level    Palpitations    Healthcare maintenance    Chronic obstructive pulmonary disease, unspecified COPD type (HCC)    Essential hypertension    For health maintenance check.  Lost to follow-up for over 16 months.  Never went to neurology for evaluation of cervical radiculopathy and tremors.  Says that he was treated rudely by staff at gastroenterology and never had his colonoscopy.  Continues to smoke.  He is interested in quitting.  Chantix has helped in the past.  He has tried patches and gums.  Recently experienced an episode of acute chest pain with associated palpitations.  He started himself on a baby aspirin and they subsided.  Continues Lexapro for anxiety and depression it is helpful.  Continues lisinopril and HCTZ for hypertension.  Blood pressure is well-controlled.  Recently has developed some lower extremity edema.   Review of Systems  Constitutional: Negative.   HENT: Negative.    Eyes:  Negative for blurred vision, discharge and redness.  Respiratory: Negative.    Cardiovascular:  Positive for chest pain and palpitations.  Gastrointestinal:  Negative for abdominal pain.  Genitourinary: Negative.    Musculoskeletal: Negative.  Negative for myalgias and neck pain.  Skin:  Negative for rash.  Neurological:  Positive for tingling and tremors. Negative for loss of consciousness and weakness.  Endo/Heme/Allergies:  Negative for polydipsia.     Current Outpatient Medications:    aspirin EC 81 MG tablet, Take 81 mg by mouth daily. Swallow whole., Disp: , Rfl:    cyanocobalamin (VITAMIN B12) 500 MCG tablet, Take 500 mcg by mouth daily., Disp: , Rfl:    escitalopram (LEXAPRO) 20 MG tablet, Take 1 tablet by mouth once daily, Disp: 30 tablet, Rfl: 1   hydrochlorothiazide (HYDRODIURIL) 25 MG tablet, Take 1 tablet (25 mg total) by mouth daily., Disp: 90 tablet, Rfl: 0   lisinopril (ZESTRIL) 40 MG tablet, Take 1 tablet (40 mg total) by mouth daily., Disp: 90 tablet, Rfl: 0   simvastatin (ZOCOR) 40 MG tablet, Take 1 tablet (40 mg total) by mouth at bedtime., Disp: 90 tablet, Rfl: 0   Varenicline Tartrate, Starter, (CHANTIX STARTING MONTH PAK) 0.5 MG X 11 & 1 MG X 42 TBPK, Pharmacy to instruct please follow-up with me prior to finishing., Disp: 1 each, Rfl: 0   Objective:     BP 128/78   Pulse 75   Temp 98.8 F (37.1 C)   Ht 5\' 8"  (1.727 m)   Wt 216 lb 12.8 oz (98.3 kg)   BMI 32.96 kg/m    Physical Exam Constitutional:      General:  He is not in acute distress.    Appearance: Normal appearance. He is not ill-appearing, toxic-appearing or diaphoretic.  HENT:     Head: Normocephalic and atraumatic.     Right Ear: External ear normal.     Left Ear: External ear normal.     Mouth/Throat:     Mouth: Mucous membranes are moist.     Pharynx: Oropharynx is clear. No oropharyngeal exudate or posterior oropharyngeal erythema.  Eyes:     General: No scleral icterus.       Right eye: No discharge.        Left eye: No discharge.     Extraocular Movements: Extraocular movements intact.     Conjunctiva/sclera: Conjunctivae normal.     Pupils: Pupils are equal, round, and reactive to light.   Cardiovascular:     Rate and Rhythm: Normal rate and regular rhythm.  Pulmonary:     Effort: Pulmonary effort is normal. No respiratory distress.     Breath sounds: Normal breath sounds. Decreased air movement present. No rhonchi or rales.  Abdominal:     General: Bowel sounds are normal.  Musculoskeletal:     Cervical back: No rigidity or tenderness.     Right lower leg: Edema (swelling with trace edema) present.     Left lower leg: Edema (swelling with trace edema) present.  Lymphadenopathy:     Cervical: No cervical adenopathy.  Skin:    General: Skin is warm and dry.  Neurological:     Mental Status: He is alert and oriented to person, place, and time.  Psychiatric:        Mood and Affect: Mood normal.        Behavior: Behavior normal.      No results found for any visits on 06/30/22.    The 10-year ASCVD risk score (Arnett DK, et al., 2019) is: 23.6%    Assessment & Plan:   Tobacco use -     Varenicline Tartrate (Starter); Pharmacy to instruct please follow-up with me prior to finishing.  Dispense: 1 each; Refill: 0  Colon cancer screening -     Ambulatory referral to Gastroenterology  Screening for prostate cancer -     PSA  Depression with anxiety  Cervical radiculopathy -     Ambulatory referral to Neurology  Tremor observed on examination -     Ambulatory referral to Neurology  Elevated LDL cholesterol level -     CBC -     Comprehensive metabolic panel -     Lipid panel  Palpitations -     TSH -     Ambulatory referral to Cardiology  Healthcare maintenance -     Urinalysis, Routine w reflex microscopic  Chronic obstructive pulmonary disease, unspecified COPD type (HCC) -     Ambulatory referral to Pulmonology  Essential hypertension    Return in about 6 weeks (around 08/11/2022).  Health maintenance labs as indicated above.  Cardiology referral for recent episodes of chest pain with palpitations.  Referring again for colonoscopy.   Referring again for evaluation of tremor and cervical radiculopathy to neurology.  Referring to pulmonology for probable COPD and screening CT of the lungs.  Fasting labs today.  Mliss Sax, MD

## 2022-07-01 LAB — URINALYSIS, ROUTINE W REFLEX MICROSCOPIC
Bilirubin Urine: NEGATIVE
Hgb urine dipstick: NEGATIVE
Ketones, ur: NEGATIVE
Leukocytes,Ua: NEGATIVE
Nitrite: NEGATIVE
Specific Gravity, Urine: 1.025 (ref 1.000–1.030)
Total Protein, Urine: NEGATIVE
Urine Glucose: NEGATIVE
Urobilinogen, UA: 1 (ref 0.0–1.0)
pH: 6.5 (ref 5.0–8.0)

## 2022-07-01 LAB — CBC
HCT: 41.2 % (ref 39.0–52.0)
Hemoglobin: 14 g/dL (ref 13.0–17.0)
MCHC: 33.8 g/dL (ref 30.0–36.0)
MCV: 97.7 fl (ref 78.0–100.0)
Platelets: 168 10*3/uL (ref 150.0–400.0)
RBC: 4.22 Mil/uL (ref 4.22–5.81)
RDW: 13.7 % (ref 11.5–15.5)
WBC: 6.3 10*3/uL (ref 4.0–10.5)

## 2022-07-01 LAB — COMPREHENSIVE METABOLIC PANEL
ALT: 30 U/L (ref 0–53)
AST: 19 U/L (ref 0–37)
Albumin: 4.2 g/dL (ref 3.5–5.2)
Alkaline Phosphatase: 54 U/L (ref 39–117)
BUN: 21 mg/dL (ref 6–23)
CO2: 28 mEq/L (ref 19–32)
Calcium: 9.5 mg/dL (ref 8.4–10.5)
Chloride: 103 mEq/L (ref 96–112)
Creatinine, Ser: 1.03 mg/dL (ref 0.40–1.50)
GFR: 75.54 mL/min (ref >60.00)
Glucose, Bld: 101 mg/dL — ABNORMAL HIGH (ref 70–99)
Potassium: 4.1 mEq/L (ref 3.5–5.1)
Sodium: 140 mEq/L (ref 135–145)
Total Bilirubin: 0.5 mg/dL (ref 0.2–1.2)
Total Protein: 6.6 g/dL (ref 6.0–8.3)

## 2022-07-01 LAB — LIPID PANEL
Cholesterol: 133 mg/dL (ref 0–200)
HDL: 31.6 mg/dL — ABNORMAL LOW (ref >39.00)
LDL Cholesterol: 76 mg/dL (ref 0–99)
NonHDL: 101.89
Total CHOL/HDL Ratio: 4
Triglycerides: 127 mg/dL (ref 0.0–149.0)
VLDL: 25.4 mg/dL (ref 0.0–40.0)

## 2022-07-01 LAB — PSA: PSA: 0.53 ng/mL (ref 0.10–4.00)

## 2022-07-01 LAB — TSH: TSH: 1.29 u[IU]/mL (ref 0.35–5.50)

## 2022-07-20 DIAGNOSIS — Z1211 Encounter for screening for malignant neoplasm of colon: Secondary | ICD-10-CM | POA: Diagnosis not present

## 2022-07-20 DIAGNOSIS — R079 Chest pain, unspecified: Secondary | ICD-10-CM | POA: Diagnosis not present

## 2022-07-20 DIAGNOSIS — R0602 Shortness of breath: Secondary | ICD-10-CM | POA: Diagnosis not present

## 2022-08-04 ENCOUNTER — Telehealth: Payer: Self-pay | Admitting: Family Medicine

## 2022-08-04 DIAGNOSIS — Z720 Tobacco use: Secondary | ICD-10-CM

## 2022-08-04 NOTE — Telephone Encounter (Signed)
Pt is finishing up his Varenicline Tartrate, Starter, (CHANTIX STARTING MONTH PAK) 0.5 MG X 11 & 1 MG X 42 TBPK [[id number]]  starter pack and was asked to contact Dr Doreene Burke.  [id number] - [place], [place] - [address], [place] [address] Phone: [phone]  Fax: [phone]    Pt at [phone]

## 2022-08-08 ENCOUNTER — Other Ambulatory Visit: Payer: Self-pay | Admitting: Family Medicine

## 2022-08-08 DIAGNOSIS — Z720 Tobacco use: Secondary | ICD-10-CM

## 2022-08-08 MED ORDER — VARENICLINE TARTRATE 1 MG PO TABS
1.0000 mg | ORAL_TABLET | Freq: Two times a day (BID) | ORAL | 2 refills | Status: DC
Start: 2022-08-08 — End: 2023-06-29

## 2022-08-15 ENCOUNTER — Other Ambulatory Visit: Payer: Self-pay | Admitting: Family Medicine

## 2022-08-15 DIAGNOSIS — F418 Other specified anxiety disorders: Secondary | ICD-10-CM

## 2022-08-16 ENCOUNTER — Encounter: Payer: Self-pay | Admitting: Family Medicine

## 2022-08-16 ENCOUNTER — Ambulatory Visit: Payer: Medicare HMO | Admitting: Family Medicine

## 2022-08-16 VITALS — BP 124/70 | HR 63 | Temp 98.4°F | Ht 68.0 in | Wt 212.4 lb

## 2022-08-16 DIAGNOSIS — M5412 Radiculopathy, cervical region: Secondary | ICD-10-CM

## 2022-08-16 DIAGNOSIS — R251 Tremor, unspecified: Secondary | ICD-10-CM

## 2022-08-16 DIAGNOSIS — Z720 Tobacco use: Secondary | ICD-10-CM | POA: Diagnosis not present

## 2022-08-16 DIAGNOSIS — F418 Other specified anxiety disorders: Secondary | ICD-10-CM | POA: Diagnosis not present

## 2022-08-16 MED ORDER — ESCITALOPRAM OXALATE 20 MG PO TABS
20.0000 mg | ORAL_TABLET | Freq: Every day | ORAL | 0 refills | Status: DC
Start: 2022-08-16 — End: 2022-12-06

## 2022-08-16 NOTE — Progress Notes (Signed)
Established Patient Office Visit   Subjective:  Patient ID: Wayne Wheeler, male    DOB: January 17, 1955  Age: 67 y.o. MRN: [id number]  Chief Complaint  Patient presents with   Medical Management of Chronic Issues    6 weeks follow up. Pt states he still has right arm pain.     HPI Encounter Diagnoses  Name Primary?   Depression with anxiety Yes   Tobacco use    Cervical radiculopathy    Tremor observed on examination    Has upcoming consultation scheduled with pulmonology and cardiology.  He is still having pain radiating into his right arm.  Was referred to neurology last visit and has not heard about an appointment.  He is currently taking Chantix 1 mg twice daily.  He continues to smoke but has decreased the amount.  He has been smoking about a pack and a half a day and is now down to less than a pack a day.  Is planning on continuing to wean tobacco.  Lexapro seems to be helping his mood and anxiety.  He is a little bit tired during the day.  He is taking the Lexapro in the morning.   Review of Systems  Constitutional: Negative.   HENT: Negative.    Eyes:  Negative for blurred vision, discharge and redness.  Respiratory: Negative.    Cardiovascular: Negative.   Gastrointestinal:  Negative for abdominal pain.  Genitourinary: Negative.   Musculoskeletal: Negative.  Negative for myalgias.  Skin:  Negative for rash.  Neurological:  Negative for tingling, loss of consciousness and weakness.  Endo/Heme/Allergies:  Negative for polydipsia.  Psychiatric/Behavioral:  Negative for depression. The patient is not nervous/anxious.       06/30/2022    2:01 PM 09/14/2021    2:38 PM 03/02/2021   11:02 AM  Depression screen PHQ 2/9  Decreased Interest 0 0 0  Down, Depressed, Hopeless 0 0 0  PHQ - 2 Score 0 0 0      Current Outpatient Medications:    aspirin EC 81 MG tablet, Take 81 mg by mouth daily. Swallow whole., Disp: , Rfl:    cyanocobalamin (VITAMIN B12) 500 MCG tablet, Take 500 mcg  by mouth daily., Disp: , Rfl:    hydrochlorothiazide (HYDRODIURIL) 25 MG tablet, Take 1 tablet (25 mg total) by mouth daily., Disp: 90 tablet, Rfl: 0   lisinopril (ZESTRIL) 40 MG tablet, Take 1 tablet (40 mg total) by mouth daily., Disp: 90 tablet, Rfl: 0   simvastatin (ZOCOR) 40 MG tablet, Take 1 tablet (40 mg total) by mouth at bedtime., Disp: 90 tablet, Rfl: 0   varenicline (CHANTIX) 1 MG tablet, Take 1 tablet (1 mg total) by mouth 2 (two) times daily., Disp: 60 tablet, Rfl: 2   escitalopram (LEXAPRO) 20 MG tablet, Take 1 tablet (20 mg total) by mouth at bedtime., Disp: 90 tablet, Rfl: 0   Objective:     BP 124/70   Pulse 63   Temp 98.4 F (36.9 C)   Ht 5\' 8"  (1.727 m)   Wt 212 lb 6.4 oz (96.3 kg)   SpO2 (!) 63%   BMI 32.30 kg/m    Physical Exam Constitutional:      General: He is not in acute distress.    Appearance: Normal appearance. He is not ill-appearing, toxic-appearing or diaphoretic.  HENT:     Head: Normocephalic and atraumatic.     Right Ear: External ear normal.     Left Ear: External ear normal.  Eyes:     General: No scleral icterus.       Right eye: No discharge.        Left eye: No discharge.     Extraocular Movements: Extraocular movements intact.     Conjunctiva/sclera: Conjunctivae normal.  Pulmonary:     Effort: Pulmonary effort is normal. No respiratory distress.  Skin:    General: Skin is warm and dry.  Neurological:     Mental Status: He is alert and oriented to person, place, and time.     Motor: Tremor present.  Psychiatric:        Mood and Affect: Mood normal.        Behavior: Behavior normal.      No results found for any visits on 08/16/22.    The 10-year ASCVD risk score (Arnett DK, et al., 2019) is: 19.8%    Assessment & Plan:   Depression with anxiety -     Escitalopram Oxalate; Take 1 tablet (20 mg total) by mouth at bedtime.  Dispense: 90 tablet; Refill: 0  Tobacco use  Cervical radiculopathy -     Ambulatory referral  to Neurology  Tremor observed on examination -     Ambulatory referral to Neurology    Return in about 8 weeks (around 10/11/2022).  Continue Chantix 1 mg twice daily.  Continue weaning cigarettes.  Continue Lexapro and be sure to take it at night.  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  Mliss Sax, MD

## 2022-08-17 ENCOUNTER — Encounter: Payer: Self-pay | Admitting: Neurology

## 2022-08-18 NOTE — Progress Notes (Signed)
Initial neurology clinic note  Reason for Evaluation: Consultation requested by Mliss Sax,* for an opinion regarding right arm pain and tremor. My final recommendations will be communicated back to the requesting physician by way of shared medical record or letter to requesting physician via [place] mail.  HPI: This is [name], a 67 y.o. right-handed male with a medical history of HTN, HLD, pre-diabetes, depression, current smoker who presents to neurology clinic with the chief complaint of right arm pain and tremor. The patient is alone.  Patient thinks his tremor in the right arm and neck after right shoulder surgery about 13 years ago. His tremor is worse when trying to use his arm. His head will shake when he is nervous. He denies a family history.  Patient has pain in his right axilla with tingling into his right hand. This has been present for about a year. It is worse when his arm is at his at his side. As previously mentioned, he does have a history of right rotator cuff pain and surgery. He still has pain in his shoulder. He has neck pain as well with limited range of motion of the neck. He denies symptoms in his left arm. He denies numbness and tingling in his legs, but does get some swelling. He takes diuretics for this. He has low back pain and leg weakness and stiffness.  Patient has seen neurology in the past for tremors. He was not given any medication for this.   Patient is on lexapro for depression. He takes B12 1000 mcg daily for energy, which he states helps with energy.   He does not report any constitutional symptoms like fever, night sweats, anorexia or unintentional weight loss.  EtOH use: Occasional drink. He does not know if EtOH changes his tremor Restrictive diet? No Family history of neuropathy/myopathy/neurologic disease? none   MEDICATIONS:  Outpatient Encounter Medications as of 08/24/2022  Medication Sig   aspirin EC 81 MG tablet Take 81 mg  by mouth daily. Swallow whole.   cyanocobalamin (VITAMIN B12) 500 MCG tablet Take 500 mcg by mouth daily.   escitalopram (LEXAPRO) 20 MG tablet Take 1 tablet (20 mg total) by mouth at bedtime.   hydrochlorothiazide (HYDRODIURIL) 25 MG tablet Take 1 tablet (25 mg total) by mouth daily.   lisinopril (ZESTRIL) 40 MG tablet Take 1 tablet (40 mg total) by mouth daily.   simvastatin (ZOCOR) 40 MG tablet Take 1 tablet (40 mg total) by mouth at bedtime.   varenicline (CHANTIX) 1 MG tablet Take 1 tablet (1 mg total) by mouth 2 (two) times daily.   No facility-administered encounter medications on file as of 08/24/2022.    PAST MEDICAL HISTORY: Past Medical History:  Diagnosis Date   Depression     PAST SURGICAL HISTORY: Past Surgical History:  Procedure Laterality Date   SHOULDER SURGERY Right 06/10/2009    ALLERGIES: Allergies  Allergen Reactions   Prednisone Other (See Comments)    Dyspepsia    FAMILY HISTORY: Family History  Problem Relation Age of Onset   Depression Mother     SOCIAL HISTORY: Social History   Tobacco Use   Smoking status: Some Days    Current packs/day: 0.50    Types: Cigarettes   Smokeless tobacco: Never  Vaping Use   Vaping status: Never Used  Substance Use Topics   Alcohol use: Yes    Alcohol/week: 1.0 standard drink of alcohol    Types: 1 Cans of beer per week  Comment: social   Drug use: Never   Social History   Social History Narrative   Are you right handed or left handed? Right   Are you currently employed ?    What is your current occupation?retired   Do you live at home alone? yes   Who lives with you? Cat and dog   What type of home do you live in: 1 story or 2 story? two    Caffeine  2 cups daily     OBJECTIVE: PHYSICAL EXAM: BP (!) 115/58   Pulse 66   Ht 5\' 8"  (1.727 m)   Wt 217 lb (98.4 kg)   SpO2 96%   BMI 32.99 kg/m   General: General appearance: Awake and alert. No distress. Cooperative with exam.  Skin: No  obvious rash or jaundice. HEENT: Atraumatic. Anicteric. Lungs: Non-labored breathing on room air  Extremities: Peripheral edema in bilateral lower extremities. No obvious deformity.  Psych: Affect appropriate.  Neurological: Mental Status: Alert. Speech fluent. No pseudobulbar affect Cranial Nerves: CNII: No RAPD. Visual fields grossly intact. CNIII, IV, VI: PERRL. No nystagmus. EOMI. CN V: Facial sensation intact bilaterally to fine touch. CN VII: Facial muscles symmetric and strong. No ptosis at rest. CN VIII: Hearing grossly intact bilaterally. CN IX: No hypophonia. CN X: Palate elevates symmetrically. CN XI: Full strength shoulder shrug bilaterally. CN XII: Tongue protrusion full and midline. No atrophy or fasciculations. No significant dysarthria Motor: Tone is normal. Intention tremor in bilateral upper extremities (right > left). Tremor in neck. No atrophy.  Individual muscle group testing (MRC grade out of 5):  Movement     Neck flexion 5    Neck extension 5     Right Left   Shoulder abduction 5 5   Shoulder adduction 5 5   Shoulder ext rotation 5 5   Shoulder int rotation 5 5   Elbow flexion 5 5   Elbow extension 5 5   Wrist extension 5 5   Wrist flexion 5 5   Finger abduction - FDI 5 5   Finger abduction - ADM 5 5   Finger extension 5 5   Finger distal flexion - 2/3 5 5    Finger distal flexion - 4/5 5 5    Thumb flexion - FPL 5 5   Thumb abduction - APB 5 5    Hip flexion 5 5   Hip extension 5 5   Hip adduction 5 5   Hip abduction 5 5   Knee extension 5 5   Knee flexion 5 5   Dorsiflexion 5 5   Plantarflexion 5 5     Reflexes:  Right Left   Bicep 2+ 2+   Tricep 2+ 2+   BrRad 2+ 2+   Knee 2+ 2+   Ankle 2+ 2+    Pathological Reflexes: Babinski: mute response bilaterally Hoffman: absent bilaterally Troemner: absent bilaterally Sensation: Pinprick: Intact in lower extremities; diminished in right arm below the elbow Coordination: Intact  finger-to- nose-finger bilaterally (intention tremor as above). Romberg negative. Gait: Able to rise from chair with arms crossed unassisted. Normal, narrow-based gait. Able to walk on toes and heels.  Lab and Test Review: Internal labs: 06/30/22: TSH wnl CMP unremarkable CBC unremarkable Lipid panel: Component     Latest Ref Rng 06/30/2022  Cholesterol     0 - 200 mg/dL 130   Triglycerides     0.0 - 149.0 mg/dL 865.7   HDL Cholesterol     >39.00 mg/dL  31.60 (L)   VLDL     0.0 - 40.0 mg/dL 16.1   LDL (calc)     0 - 99 mg/dL 76   Total CHOL/HDL Ratio 4   NonHDL 101.89     HIV (03/26/21) non-reactive HbA1c (05/15/20): 5.9  Imaging: Cervical xray (03/02/21): FINDINGS: There is no evidence of cervical spine fracture or prevertebral soft tissue swelling. Alignment is normal.   There is preservation of the normal vertebral body and disc heights.   There is osteopenia. Mild cervical spondylosis. There is multilevel uncinate joint and facet hypertrophy.   Degenerative foraminal stenosis is noted which is moderate to severe on the left at C2-3, mild-to-moderate on the right and moderate to severe on the left at C3-4, C4-5 and C5-6, moderate to severe on the left at C6-7.   There is no precervical soft tissue thickening. Visualized lung apices are clear. There are calcifications in the cervical carotid arteries.   IMPRESSION: Osteopenia, degenerative changes with multilevel left-greater-than-right foraminal stenosis, cervical spondylosis. No evidence of fractures. Carotid atherosclerosis.  ASSESSMENT: Miranda Mcelhinny is a 67 y.o. male who presents for evaluation of pain in RUE and tremor in hands and neck. He has a relevant medical history of HTN, HLD, pre-diabetes, depression, current smoker. His neurological examination is pertinent for intention tremor in bilateral hands and tremor in neck and diminished sensation in right arm below the elbow. The etiology of his RUE pain is  currently unclear. It sounds neuropathic and could be 2/2 radiculopathy or a mononeuropathy such as ulnar at elbow. It could also be orthopedic in nature. I will get an EMG to clarify further.  In terms of his tremor, it is consistent with essential tremor. After discussion, patient preferred a treatment that could potentially treat tremor and nerve pain, so I will start with gabapentin, but he may need primidone for his tremor.  PLAN: -EMG: RUE -Gabapentin 300 mg at bedtime for 1 week, then BID (may work for tremor and pain, so patient preferred this to primidone)  -Return to clinic in 3 months  The impression above as well as the plan as outlined below were extensively discussed with the patient who voiced understanding. All questions were answered to their satisfaction.  When available, results of the above investigations and possible further recommendations will be communicated to the patient via telephone/MyChart. Patient to call office if not contacted after expected testing turnaround time.   Total time spent reviewing records, interview, history/exam, documentation, and coordination of care on day of encounter:  60 min   Thank you for allowing me to participate in patient's care.  If I can answer any additional questions, I would be pleased to do so.  Jacquelyne Balint, MD   CC: Doreene Burke Talmadge Coventry, MD [address] [place] [address]  CC: Referring provider: Mliss Sax, MD [address] [place],  [place] [address]

## 2022-08-24 ENCOUNTER — Ambulatory Visit: Payer: Medicare HMO | Admitting: Neurology

## 2022-08-24 ENCOUNTER — Encounter: Payer: Self-pay | Admitting: Neurology

## 2022-08-24 VITALS — BP 115/58 | HR 66 | Ht 68.0 in | Wt 217.0 lb

## 2022-08-24 DIAGNOSIS — G250 Essential tremor: Secondary | ICD-10-CM | POA: Diagnosis not present

## 2022-08-24 DIAGNOSIS — M79601 Pain in right arm: Secondary | ICD-10-CM | POA: Diagnosis not present

## 2022-08-24 DIAGNOSIS — M542 Cervicalgia: Secondary | ICD-10-CM

## 2022-08-24 MED ORDER — GABAPENTIN 300 MG PO CAPS
300.0000 mg | ORAL_CAPSULE | Freq: Two times a day (BID) | ORAL | 5 refills | Status: DC
Start: 2022-08-24 — End: 2023-03-31

## 2022-08-24 NOTE — Progress Notes (Unsigned)
Cardiology Office Note   Date:  08/25/2022   ID:  Wayne Wheeler, DOB 03-10-1955, MRN [id number]  PCP:  Mliss Sax, MD  Cardiologist:   Rollene Rotunda, MD Referring:  Mliss Sax, MD  Chief Complaint  Patient presents with   Chest Pain      History of Present Illness: Wayne Wheeler is a 67 y.o. male who presents for evaluation of chest pain.  He said that the chest discomfort happened in June.  He had some leg swelling.  He went to see his primary doctor.  Prior to that he developed some chest discomfort.  He said this was across his chest.  It was dull.  It was moderate.  He said it was really 1 day but couple of days later he went about 81 mg aspirin so he may actually been having discomfort for longer.  He said that helped his pain go away.  He did have some vague right-sided jaw discomfort and some pain going up his temple to his eyeball.  He never had this discomfort before and he has not been really having it since.  He is limited by back pain.  He has chronic dyspnea and is actually going to see a pulmonologist.  I did see an x-ray that showed some aortic calcium.  He has not had prior cardiac testing.   Past Medical History:  Diagnosis Date   Depression    Dyslipidemia    Hypertension    Resting tremor     Past Surgical History:  Procedure Laterality Date   SHOULDER SURGERY Right 06/10/2009     Current Outpatient Medications  Medication Sig Dispense Refill   aspirin EC 81 MG tablet Take 81 mg by mouth daily. Swallow whole.     cyanocobalamin (VITAMIN B12) 500 MCG tablet Take 500 mcg by mouth daily.     escitalopram (LEXAPRO) 20 MG tablet Take 1 tablet (20 mg total) by mouth at bedtime. 90 tablet 0   gabapentin (NEURONTIN) 300 MG capsule Take 1 capsule (300 mg total) by mouth 2 (two) times daily. 60 capsule 5   hydrochlorothiazide (HYDRODIURIL) 25 MG tablet Take 1 tablet (25 mg total) by mouth daily. 90 tablet 0   lisinopril (ZESTRIL) 40 MG tablet  Take 1 tablet (40 mg total) by mouth daily. 90 tablet 0   metoprolol tartrate (LOPRESSOR) 25 MG tablet Take 1 tablet (25 mg total) by mouth once for 1 dose. Take 2 hours before CT test 1 tablet 0   simvastatin (ZOCOR) 40 MG tablet Take 1 tablet (40 mg total) by mouth at bedtime. 90 tablet 0   varenicline (CHANTIX) 1 MG tablet Take 1 tablet (1 mg total) by mouth 2 (two) times daily. 60 tablet 2   No current facility-administered medications for this visit.    Allergies:   Prednisone    Social History:  The patient  reports that he has been smoking cigarettes. He has never used smokeless tobacco. He reports current alcohol use of about 1.0 standard drink of alcohol per week. He reports that he does not use drugs.   Family History:  The patient's family history includes Depression in his mother; Heart disease in his father.    ROS:  Please see the history of present illness.   Otherwise, review of systems are positive for none.   All other systems are reviewed and negative.    PHYSICAL EXAM: VS:  BP 134/86   Pulse (!) 58   Ht 5'  8" (1.727 m)   Wt 217 lb 9.6 oz (98.7 kg)   SpO2 96%   BMI 33.09 kg/m  , BMI Body mass index is 33.09 kg/m. GENERAL:  Well appearing HEENT:  Pupils equal round and reactive, fundi not visualized, oral mucosa unremarkable NECK:  No jugular venous distention, waveform within normal limits, carotid upstroke brisk and symmetric, no bruits, no thyromegaly LYMPHATICS:  No cervical, inguinal adenopathy LUNGS:  Clear to auscultation bilaterally BACK:  No CVA tenderness CHEST:  Unremarkable HEART:  PMI not displaced or sustained,S1 and S2 within normal limits, no S3, no S4, no clicks, no rubs, no murmurs ABD:  Flat, positive bowel sounds normal in frequency in pitch, no bruits, no rebound, no guarding, no midline pulsatile mass, no hepatomegaly, no splenomegaly EXT:  2 plus pulses throughout, no edema, no cyanosis no clubbing SKIN:  No rashes no nodules NEURO:   Cranial nerves II through XII grossly intact, motor grossly intact throughout [facility]:  Cognitively intact, oriented to person place and time    EKG:  EKG Interpretation Date/Time:  Thursday August 25 2022 13:52:13 EDT Ventricular Rate:  58 PR Interval:  146 QRS Duration:  96 QT Interval:  374 QTC Calculation: 367 R Axis:   36  Text Interpretation: Sinus bradycardia Nonspecific ST and T wave abnormality No previous ECGs available Confirmed by Rollene Rotunda ([id number]) on 08/25/2022 2:36:12 PM     Recent Labs: 06/30/2022: ALT 30; BUN 21; Creatinine, Ser 1.03; Hemoglobin 14.0; Platelets 168.0; Potassium 4.1; Sodium 140; TSH 1.29    Lipid Panel    Component Value Date/Time   CHOL 133 06/30/2022 1521   TRIG 127.0 06/30/2022 1521   HDL 31.60 (L) 06/30/2022 1521   CHOLHDL 4 06/30/2022 1521   VLDL 25.4 06/30/2022 1521   LDLCALC 76 06/30/2022 1521   LDLCALC 66 05/15/2020 1451   LDLDIRECT 123.0 03/02/2021 1139      Wt Readings from Last 3 Encounters:  08/25/22 217 lb 9.6 oz (98.7 kg)  08/24/22 217 lb (98.4 kg)  08/16/22 212 lb 6.4 oz (96.3 kg)      Other studies Reviewed: Additional studies/ records that were reviewed today include: Labs, primary care office notes. Review of the above records demonstrates:  Please see elsewhere in the note.     ASSESSMENT AND PLAN:  Chest discomfort: The patient has some anginal and some nonanginal features.  He has significant cardiovascular risk factors.  Pretest probability of obstructive coronary disease is moderately high.  I will order a coronary CTA.  He will also continue with aggressive risk reduction.  Dyslipidemia: LDL was 76.  Goals of therapy will be based on the results of the above.  Tobacco abuse: I am proud of him because he is taking Chantix.  I encouraged continued attempts at tobacco abstinence.   Current medicines are reviewed at length with the patient today.  The patient does not have concerns regarding  medicines.  The following changes have been made:  no change  Labs/ tests ordered today include:   Orders Placed This Encounter  Procedures   CT CORONARY MORPH W/CTA COR W/SCORE W/CA W/CM &/OR WO/CM   Basic metabolic panel   EKG 12-Lead     Disposition:   FU with with me as needed based on the results of the above.     Signed, Rollene Rotunda, MD  08/25/2022 3:08 PM    [facility]

## 2022-08-24 NOTE — Patient Instructions (Signed)
I saw you today for right arm pain and tremors.  I am not sure the cause of your right arm pain, but I want to look for a nerve problem with a nerve test called an EMG (see more information below).  I think your tremor is something called essential tremor.  We will try a medication called gabapentin. You will take 300 mg at bedtime for 1 week then increase to taking twice daily if you are not having side effects. This could help with nerve pain and possibly with tremor.  We will discuss next steps after your EMG.  I will see you back in clinic in 3 months.  The physicians and staff at [facility] are committed to providing excellent care. You may receive a survey requesting feedback about your experience at our office. We strive to receive "very good" responses to the survey questions. If you feel that your experience would prevent you from giving the office a "very good " response, please contact our office to try to remedy the situation. We may be reached at [phone]. Thank you for taking the time out of your busy day to complete the survey.  Jacquelyne Balint, MD [facility]  ELECTROMYOGRAM AND NERVE CONDUCTION STUDIES (EMG/NCS) INSTRUCTIONS  How to Prepare The neurologist conducting the EMG will need to know if you have certain medical conditions. Tell the neurologist and other EMG lab personnel if you: Have a pacemaker or any other electrical medical device Take blood-thinning medications Have hemophilia, a blood-clotting disorder that causes prolonged bleeding Bathing Take a shower or bath shortly before your exam in order to remove oils from your skin. Don't apply lotions or creams before the exam.  What to Expect You'll likely be asked to change into a hospital gown for the procedure and lie down on an examination table. The following explanations can help you understand what will happen during the exam.  Electrodes. The neurologist or a technician places surface  electrodes at various locations on your skin depending on where you're experiencing symptoms. Or the neurologist may insert needle electrodes at different sites depending on your symptoms.  Sensations. The electrodes will at times transmit a tiny electrical current that you may feel as a twinge or spasm. The needle electrode may cause discomfort or pain that usually ends shortly after the needle is removed. If you are concerned about discomfort or pain, you may want to talk to the neurologist about taking a short break during the exam.  Instructions. During the needle EMG, the neurologist will assess whether there is any spontaneous electrical activity when the muscle is at rest - activity that isn't present in healthy muscle tissue - and the degree of activity when you slightly contract the muscle.  He or she will give you instructions on resting and contracting a muscle at appropriate times. Depending on what muscles and nerves the neurologist is examining, he or she may ask you to change positions during the exam.  After your EMG You may experience some temporary, minor bruising where the needle electrode was inserted into your muscle. This bruising should fade within several days. If it persists, contact your primary care doctor.

## 2022-08-24 NOTE — H&P (View-Only) (Signed)
Cardiology Office Note   Date:  08/25/2022   ID:  Wayne Wheeler, DOB 29-Sep-1955, MRN [id number]  PCP:  Mliss Sax, MD  Cardiologist:   Rollene Rotunda, MD Referring:  Mliss Sax, MD  Chief Complaint  Patient presents with   Chest Pain      History of Present Illness: Wayne Wheeler is a 67 y.o. male who presents for evaluation of chest pain.  He said that the chest discomfort happened in June.  He had some leg swelling.  He went to see his primary doctor.  Prior to that he developed some chest discomfort.  He said this was across his chest.  It was dull.  It was moderate.  He said it was really 1 day but couple of days later he went about 81 mg aspirin so he may actually been having discomfort for longer.  He said that helped his pain go away.  He did have some vague right-sided jaw discomfort and some pain going up his temple to his eyeball.  He never had this discomfort before and he has not been really having it since.  He is limited by back pain.  He has chronic dyspnea and is actually going to see a pulmonologist.  I did see an x-ray that showed some aortic calcium.  He has not had prior cardiac testing.   Past Medical History:  Diagnosis Date   Depression    Dyslipidemia    Hypertension    Resting tremor     Past Surgical History:  Procedure Laterality Date   SHOULDER SURGERY Right 06/10/2009     Current Outpatient Medications  Medication Sig Dispense Refill   aspirin EC 81 MG tablet Take 81 mg by mouth daily. Swallow whole.     cyanocobalamin (VITAMIN B12) 500 MCG tablet Take 500 mcg by mouth daily.     escitalopram (LEXAPRO) 20 MG tablet Take 1 tablet (20 mg total) by mouth at bedtime. 90 tablet 0   gabapentin (NEURONTIN) 300 MG capsule Take 1 capsule (300 mg total) by mouth 2 (two) times daily. 60 capsule 5   hydrochlorothiazide (HYDRODIURIL) 25 MG tablet Take 1 tablet (25 mg total) by mouth daily. 90 tablet 0   lisinopril (ZESTRIL) 40 MG tablet  Take 1 tablet (40 mg total) by mouth daily. 90 tablet 0   metoprolol tartrate (LOPRESSOR) 25 MG tablet Take 1 tablet (25 mg total) by mouth once for 1 dose. Take 2 hours before CT test 1 tablet 0   simvastatin (ZOCOR) 40 MG tablet Take 1 tablet (40 mg total) by mouth at bedtime. 90 tablet 0   varenicline (CHANTIX) 1 MG tablet Take 1 tablet (1 mg total) by mouth 2 (two) times daily. 60 tablet 2   No current facility-administered medications for this visit.    Allergies:   Prednisone    Social History:  The patient  reports that he has been smoking cigarettes. He has never used smokeless tobacco. He reports current alcohol use of about 1.0 standard drink of alcohol per week. He reports that he does not use drugs.   Family History:  The patient's family history includes Depression in his mother; Heart disease in his father.    ROS:  Please see the history of present illness.   Otherwise, review of systems are positive for none.   All other systems are reviewed and negative.    PHYSICAL EXAM: VS:  BP 134/86   Pulse (!) 58   Ht 5'  8" (1.727 m)   Wt 217 lb 9.6 oz (98.7 kg)   SpO2 96%   BMI 33.09 kg/m  , BMI Body mass index is 33.09 kg/m. GENERAL:  Well appearing HEENT:  Pupils equal round and reactive, fundi not visualized, oral mucosa unremarkable NECK:  No jugular venous distention, waveform within normal limits, carotid upstroke brisk and symmetric, no bruits, no thyromegaly LYMPHATICS:  No cervical, inguinal adenopathy LUNGS:  Clear to auscultation bilaterally BACK:  No CVA tenderness CHEST:  Unremarkable HEART:  PMI not displaced or sustained,S1 and S2 within normal limits, no S3, no S4, no clicks, no rubs, no murmurs ABD:  Flat, positive bowel sounds normal in frequency in pitch, no bruits, no rebound, no guarding, no midline pulsatile mass, no hepatomegaly, no splenomegaly EXT:  2 plus pulses throughout, no edema, no cyanosis no clubbing SKIN:  No rashes no nodules NEURO:   Cranial nerves II through XII grossly intact, motor grossly intact throughout [facility]:  Cognitively intact, oriented to person place and time    EKG:  EKG Interpretation Date/Time:  Thursday August 25 2022 13:52:13 EDT Ventricular Rate:  58 PR Interval:  146 QRS Duration:  96 QT Interval:  374 QTC Calculation: 367 R Axis:   36  Text Interpretation: Sinus bradycardia Nonspecific ST and T wave abnormality No previous ECGs available Confirmed by Rollene Rotunda ([id number]) on 08/25/2022 2:36:12 PM     Recent Labs: 06/30/2022: ALT 30; BUN 21; Creatinine, Ser 1.03; Hemoglobin 14.0; Platelets 168.0; Potassium 4.1; Sodium 140; TSH 1.29    Lipid Panel    Component Value Date/Time   CHOL 133 06/30/2022 1521   TRIG 127.0 06/30/2022 1521   HDL 31.60 (L) 06/30/2022 1521   CHOLHDL 4 06/30/2022 1521   VLDL 25.4 06/30/2022 1521   LDLCALC 76 06/30/2022 1521   LDLCALC 66 05/15/2020 1451   LDLDIRECT 123.0 03/02/2021 1139      Wt Readings from Last 3 Encounters:  08/25/22 217 lb 9.6 oz (98.7 kg)  08/24/22 217 lb (98.4 kg)  08/16/22 212 lb 6.4 oz (96.3 kg)      Other studies Reviewed: Additional studies/ records that were reviewed today include: Labs, primary care office notes. Review of the above records demonstrates:  Please see elsewhere in the note.     ASSESSMENT AND PLAN:  Chest discomfort: The patient has some anginal and some nonanginal features.  He has significant cardiovascular risk factors.  Pretest probability of obstructive coronary disease is moderately high.  I will order a coronary CTA.  He will also continue with aggressive risk reduction.  Dyslipidemia: LDL was 76.  Goals of therapy will be based on the results of the above.  Tobacco abuse: I am proud of him because he is taking Chantix.  I encouraged continued attempts at tobacco abstinence.   Current medicines are reviewed at length with the patient today.  The patient does not have concerns regarding  medicines.  The following changes have been made:  no change  Labs/ tests ordered today include:   Orders Placed This Encounter  Procedures   CT CORONARY MORPH W/CTA COR W/SCORE W/CA W/CM &/OR WO/CM   Basic metabolic panel   EKG 12-Lead     Disposition:   FU with with me as needed based on the results of the above.     Signed, Rollene Rotunda, MD  08/25/2022 3:08 PM    [facility]

## 2022-08-25 ENCOUNTER — Ambulatory Visit: Payer: Medicare HMO | Attending: Cardiology | Admitting: Cardiology

## 2022-08-25 ENCOUNTER — Encounter: Payer: Self-pay | Admitting: Cardiology

## 2022-08-25 VITALS — BP 134/86 | HR 58 | Ht 68.0 in | Wt 217.6 lb

## 2022-08-25 DIAGNOSIS — R079 Chest pain, unspecified: Secondary | ICD-10-CM

## 2022-08-25 DIAGNOSIS — R072 Precordial pain: Secondary | ICD-10-CM | POA: Diagnosis not present

## 2022-08-25 DIAGNOSIS — R002 Palpitations: Secondary | ICD-10-CM

## 2022-08-25 DIAGNOSIS — Z01812 Encounter for preprocedural laboratory examination: Secondary | ICD-10-CM

## 2022-08-25 MED ORDER — METOPROLOL TARTRATE 25 MG PO TABS: 25.0000 mg | ORAL_TABLET | Freq: Once | ORAL | 0 refills | Status: DC

## 2022-08-25 NOTE — Patient Instructions (Signed)
Medication Instructions:  - Metoprolol, 1 tablet 2 hours before CT Test.   *If you need a refill on your cardiac medications before your next appointment, please call your pharmacy*   Lab Work: - BMP TODAY for CT test  If you have labs (blood work) drawn today and your tests are completely normal, you will receive your results only by: MyChart Message (if you have MyChart) OR A paper copy in the mail If you have any lab test that is abnormal or we need to change your treatment, we will call you to review the results.   Testing/Procedures:   Your cardiac CT will be scheduled at one of the below locations:   [facility] [address] [place], [place] [address] [phone]   If scheduled at [facility], please arrive at the [facility] and Children's Entrance (Entrance C2) of [facility] 30 minutes prior to test start time. You can use the FREE valet parking offered at entrance C (encouraged to control the heart rate for the test)  Proceed to the [facility] Radiology Department (first floor) to check-in and test prep.  All radiology patients and guests should use entrance C2 at [facility], accessed from [facility], even though the hospital's physical address listed is [address]    If scheduled at [facility] or [facility], please arrive 15 mins early for check-in and test prep.  There is spacious parking and easy access to the radiology department from the [facility] Heart and Vascular entrance. Please enter here and check-in with the desk attendant.   Please follow these instructions carefully (unless otherwise directed):  An IV will be required for this test and Nitroglycerin will be given.  Hold all erectile dysfunction medications at least 3 days (72 hrs) prior to test. (Ie viagra, cialis, sildenafil, tadalafil, etc)   On the Night Before the Test: Be sure to Drink  plenty of water. Do not consume any caffeinated/decaffeinated beverages or chocolate 12 hours prior to your test. Do not take any antihistamines 12 hours prior to your test.  On the Day of the Test: Drink plenty of water until 1 hour prior to the test. Do not eat any food 1 hour prior to test. You may take your regular medications prior to the test.  Take metoprolol (Lopressor) two hours prior to test. If you take Furosemide/Hydrochlorothiazide/Spironolactone, please HOLD on the morning of the test.       After the Test: Drink plenty of water. After receiving IV contrast, you may experience a mild flushed feeling. This is normal. On occasion, you may experience a mild rash up to 24 hours after the test. This is not dangerous. If this occurs, you can take Benadryl 25 mg and increase your fluid intake. If you experience trouble breathing, this can be serious. If it is severe call 911 IMMEDIATELY. If it is mild, please call our office.  We will call to schedule your test 2-4 weeks out understanding that some insurance companies will need an authorization prior to the service being performed.   For more information and frequently asked questions, please visit our website : [email or web address]  For non-scheduling related questions, please contact the cardiac imaging nurse navigator should you have any questions/concerns: Cardiac Imaging Nurse Navigators Direct Office Dial: [phone]   For scheduling needs, including cancellations and rescheduling, please call [place], [phone].    Follow-Up: At [facility], you and your health needs  are our priority.  As part of our continuing mission to provide you with exceptional heart care, we have created designated Provider Care Teams.  These Care Teams include your primary Cardiologist (physician) and Advanced Practice Providers (APPs -  Physician Assistants and Nurse Practitioners) who all work together to provide you  with the care you need, when you need it.  We recommend signing up for the patient portal called "MyChart".  Sign up information is provided on this After Visit Summary.  MyChart is used to connect with patients for Virtual Visits (Telemedicine).  Patients are able to view lab/test results, encounter notes, upcoming appointments, etc.  Non-urgent messages can be sent to your provider as well.   To learn more about what you can do with MyChart, go to [email or web address].    Your next appointment:   Follow up as needed pending results of CT test   Provider:   Rollene Rotunda, MD

## 2022-08-26 ENCOUNTER — Encounter: Payer: Self-pay | Admitting: *Deleted

## 2022-08-26 LAB — BASIC METABOLIC PANEL
BUN/Creatinine Ratio: 10 (ref 10–24)
BUN: 10 mg/dL (ref 8–27)
CO2: 25 mmol/L (ref 20–29)
Calcium: 9.7 mg/dL (ref 8.6–10.2)
Chloride: 99 mmol/L (ref 96–106)
Creatinine, Ser: 1.01 mg/dL (ref 0.76–1.27)
Glucose: 147 mg/dL — ABNORMAL HIGH (ref 70–99)
Potassium: 4.9 mmol/L (ref 3.5–5.2)
Sodium: 137 mmol/L (ref 134–144)
eGFR: 82 mL/min/{1.73_m2} (ref >59)

## 2022-08-30 ENCOUNTER — Telehealth (HOSPITAL_COMMUNITY): Payer: Self-pay | Admitting: *Deleted

## 2022-08-30 NOTE — Telephone Encounter (Signed)
Reaching out to patient to offer assistance regarding upcoming cardiac imaging study; pt verbalizes understanding of appt date/time, parking situation and where to check in, pre-test NPO status and medications ordered, and verified current allergies; name and call back number provided for further questions should they arise  Larey Brick RN Navigator Cardiac Imaging [facility] [phone] office [phone] cell  Patient confirmed HR was 67 BPM today. He is to take 25mg  metoprolol tartrate two hours prior to his cardiac CT scan. He will arrive at 3 PM.

## 2022-08-30 NOTE — Telephone Encounter (Signed)
Attempted to call patient regarding upcoming cardiac CT appointment. Left message on voicemail with name and callback number Hayley Sharpe RN Navigator Cardiac Imaging [facility] [phone] Office   

## 2022-08-31 ENCOUNTER — Ambulatory Visit (HOSPITAL_COMMUNITY)
Admission: RE | Admit: 2022-08-31 | Discharge: 2022-08-31 | Disposition: A | Discharge status: Home / Self Care | Payer: Medicare HMO | Source: Ambulatory Visit | Attending: Cardiology | Admitting: Cardiology

## 2022-08-31 DIAGNOSIS — I2510 Atherosclerotic heart disease of native coronary artery without angina pectoris: Secondary | ICD-10-CM

## 2022-08-31 DIAGNOSIS — R072 Precordial pain: Secondary | ICD-10-CM | POA: Insufficient documentation

## 2022-08-31 MED ORDER — NITROGLYCERIN 0.4 MG SL SUBL
0.8000 mg | SUBLINGUAL_TABLET | Freq: Once | SUBLINGUAL | Status: AC
  Administered 2022-08-31: 0.8 mg via SUBLINGUAL

## 2022-08-31 MED ORDER — NITROGLYCERIN 0.4 MG SL SUBL
SUBLINGUAL_TABLET | SUBLINGUAL | Status: AC
  Filled 2022-08-31: qty 2

## 2022-08-31 MED ORDER — IOHEXOL 350 MG/ML SOLN
95.0000 mL | Freq: Once | INTRAVENOUS | Status: AC | PRN
  Administered 2022-08-31: 95 mL via INTRAVENOUS

## 2022-09-01 ENCOUNTER — Ambulatory Visit (HOSPITAL_COMMUNITY)
Admission: RE | Admit: 2022-09-01 | Discharge: 2022-09-01 | Disposition: A | Discharge status: Home / Self Care | Payer: Medicare HMO | Source: Ambulatory Visit | Attending: Internal Medicine | Admitting: Internal Medicine

## 2022-09-01 ENCOUNTER — Other Ambulatory Visit: Payer: Self-pay | Admitting: Internal Medicine

## 2022-09-01 DIAGNOSIS — R931 Abnormal findings on diagnostic imaging of heart and coronary circulation: Secondary | ICD-10-CM

## 2022-09-01 DIAGNOSIS — I2510 Atherosclerotic heart disease of native coronary artery without angina pectoris: Secondary | ICD-10-CM

## 2022-09-06 ENCOUNTER — Telehealth: Payer: Self-pay | Admitting: *Deleted

## 2022-09-06 NOTE — Telephone Encounter (Signed)
Cardiac Catheterization scheduled at [facility] for: Wednesday September 07, 2022 10:30 AM Arrival time [facility] Main Entrance A at: 8 AM-needs CBC  Nothing to eat after midnight prior to procedure, clear liquids until 5 AM day of procedure.  Medication instructions: -Hold:  Hydrochlorothiazide-AM of procedure -Other usual morning medications can be taken with sips of water including aspirin 81 mg.  Plan to go home the same day, you will only stay overnight if medically necessary.  You must have responsible adult to drive you home.  Someone must be with you the first 24 hours after you arrive home.  Reviewed procedure instructions with patient.

## 2022-09-07 ENCOUNTER — Telehealth: Payer: Self-pay | Admitting: Cardiology

## 2022-09-07 ENCOUNTER — Other Ambulatory Visit: Payer: Self-pay

## 2022-09-07 ENCOUNTER — Other Ambulatory Visit (HOSPITAL_COMMUNITY): Payer: Self-pay

## 2022-09-07 ENCOUNTER — Ambulatory Visit (HOSPITAL_COMMUNITY)
Admission: RE | Admit: 2022-09-07 | Discharge: 2022-09-07 | Disposition: A | Discharge status: Home / Self Care | Payer: Medicare HMO | Source: Ambulatory Visit | Attending: Interventional Cardiology | Admitting: Interventional Cardiology

## 2022-09-07 ENCOUNTER — Encounter (HOSPITAL_COMMUNITY)
Admission: RE | Disposition: A | Discharge status: Home / Self Care | Payer: Self-pay | Source: Ambulatory Visit | Attending: Interventional Cardiology

## 2022-09-07 DIAGNOSIS — R079 Chest pain, unspecified: Secondary | ICD-10-CM

## 2022-09-07 DIAGNOSIS — R7303 Prediabetes: Secondary | ICD-10-CM | POA: Insufficient documentation

## 2022-09-07 DIAGNOSIS — E785 Hyperlipidemia, unspecified: Secondary | ICD-10-CM | POA: Insufficient documentation

## 2022-09-07 DIAGNOSIS — I10 Essential (primary) hypertension: Secondary | ICD-10-CM | POA: Insufficient documentation

## 2022-09-07 DIAGNOSIS — Z01812 Encounter for preprocedural laboratory examination: Secondary | ICD-10-CM

## 2022-09-07 DIAGNOSIS — Z955 Presence of coronary angioplasty implant and graft: Secondary | ICD-10-CM

## 2022-09-07 DIAGNOSIS — F17210 Nicotine dependence, cigarettes, uncomplicated: Secondary | ICD-10-CM | POA: Insufficient documentation

## 2022-09-07 DIAGNOSIS — R0600 Dyspnea, unspecified: Secondary | ICD-10-CM | POA: Insufficient documentation

## 2022-09-07 DIAGNOSIS — Z79899 Other long term (current) drug therapy: Secondary | ICD-10-CM | POA: Diagnosis not present

## 2022-09-07 DIAGNOSIS — I25118 Atherosclerotic heart disease of native coronary artery with other forms of angina pectoris: Secondary | ICD-10-CM | POA: Insufficient documentation

## 2022-09-07 HISTORY — PX: LEFT HEART CATH AND CORONARY ANGIOGRAPHY: CATH118249

## 2022-09-07 HISTORY — PX: CORONARY STENT INTERVENTION: CATH118234

## 2022-09-07 LAB — CBC
HCT: 44 % (ref 39.0–52.0)
Hemoglobin: 14.8 g/dL (ref 13.0–17.0)
MCH: 32.7 pg (ref 26.0–34.0)
MCHC: 33.6 g/dL (ref 30.0–36.0)
MCV: 97.3 fL (ref 80.0–100.0)
Platelets: 176 10*3/uL (ref 150–400)
RBC: 4.52 MIL/uL (ref 4.22–5.81)
RDW: 13 % (ref 11.5–15.5)
WBC: 9.7 10*3/uL (ref 4.0–10.5)
nRBC: 0 % (ref 0.0–0.2)

## 2022-09-07 LAB — POCT ACTIVATED CLOTTING TIME: Activated Clotting Time: 336 seconds

## 2022-09-07 SURGERY — LEFT HEART CATH AND CORONARY ANGIOGRAPHY
Anesthesia: LOCAL

## 2022-09-07 MED ORDER — SODIUM CHLORIDE 0.9 % IV SOLN: 250.0000 mL | INTRAVENOUS | Status: DC | PRN

## 2022-09-07 MED ORDER — CLOPIDOGREL BISULFATE 300 MG PO TABS
ORAL_TABLET | ORAL | Status: AC
  Filled 2022-09-07: qty 1

## 2022-09-07 MED ORDER — SODIUM CHLORIDE 0.9 % WEIGHT BASED INFUSION: 1.0000 mL/kg/h | INTRAVENOUS | Status: DC

## 2022-09-07 MED ORDER — LIDOCAINE HCL (PF) 1 % IJ SOLN
INTRAMUSCULAR | Status: AC
  Filled 2022-09-07: qty 30

## 2022-09-07 MED ORDER — CLOPIDOGREL BISULFATE 300 MG PO TABS
ORAL_TABLET | ORAL | Status: DC | PRN
  Administered 2022-09-07: 600 mg via ORAL

## 2022-09-07 MED ORDER — MIDAZOLAM HCL 2 MG/2ML IJ SOLN
INTRAMUSCULAR | Status: AC
  Filled 2022-09-07: qty 2

## 2022-09-07 MED ORDER — SODIUM CHLORIDE 0.9% FLUSH: 3.0000 mL | Freq: Two times a day (BID) | INTRAVENOUS | Status: DC

## 2022-09-07 MED ORDER — FENTANYL CITRATE (PF) 100 MCG/2ML IJ SOLN
INTRAMUSCULAR | Status: AC
  Filled 2022-09-07: qty 2

## 2022-09-07 MED ORDER — SODIUM CHLORIDE 0.9 % IV SOLN: INTRAVENOUS | Status: AC

## 2022-09-07 MED ORDER — NITROGLYCERIN 0.4 MG SL SUBL
0.4000 mg | SUBLINGUAL_TABLET | SUBLINGUAL | 2 refills | Status: AC | PRN
  Filled 2022-09-07: qty 25, 25d supply, fill #0

## 2022-09-07 MED ORDER — NITROGLYCERIN 1 MG/10 ML FOR IR/CATH LAB
INTRA_ARTERIAL | Status: DC | PRN
  Administered 2022-09-07 (×2): 200 ug

## 2022-09-07 MED ORDER — SODIUM CHLORIDE 0.9% FLUSH: 3.0000 mL | INTRAVENOUS | Status: DC | PRN

## 2022-09-07 MED ORDER — CLOPIDOGREL BISULFATE 75 MG PO TABS
75.0000 mg | ORAL_TABLET | Freq: Every day | ORAL | 2 refills | Status: DC
  Filled 2022-09-07: qty 90, 90d supply, fill #0

## 2022-09-07 MED ORDER — VERAPAMIL HCL 2.5 MG/ML IV SOLN
INTRAVENOUS | Status: DC | PRN
  Administered 2022-09-07: 10 mL via INTRA_ARTERIAL

## 2022-09-07 MED ORDER — IOHEXOL 350 MG/ML SOLN
INTRAVENOUS | Status: DC | PRN
  Administered 2022-09-07: 125 mL

## 2022-09-07 MED ORDER — LABETALOL HCL 5 MG/ML IV SOLN: 10.0000 mg | INTRAVENOUS | Status: DC | PRN

## 2022-09-07 MED ORDER — SODIUM CHLORIDE 0.9 % IV SOLN
INTRAVENOUS | Status: DC | PRN
  Administered 2022-09-07: 250 mL via INTRAVENOUS

## 2022-09-07 MED ORDER — ACETAMINOPHEN 325 MG PO TABS: 650.0000 mg | ORAL_TABLET | ORAL | Status: DC | PRN

## 2022-09-07 MED ORDER — NITROGLYCERIN 1 MG/10 ML FOR IR/CATH LAB
INTRA_ARTERIAL | Status: AC
  Filled 2022-09-07: qty 10

## 2022-09-07 MED ORDER — ASPIRIN 81 MG PO CHEW: 81.0000 mg | CHEWABLE_TABLET | Freq: Every day | ORAL | Status: DC

## 2022-09-07 MED ORDER — CLOPIDOGREL BISULFATE 75 MG PO TABS: 75.0000 mg | ORAL_TABLET | Freq: Every day | ORAL | Status: DC

## 2022-09-07 MED ORDER — HEPARIN SODIUM (PORCINE) 1000 UNIT/ML IJ SOLN
INTRAMUSCULAR | Status: DC | PRN
  Administered 2022-09-07: 5000 [IU] via INTRAVENOUS
  Administered 2022-09-07: 7000 [IU] via INTRAVENOUS

## 2022-09-07 MED ORDER — FAMOTIDINE IN NACL 20-0.9 MG/50ML-% IV SOLN
INTRAVENOUS | Status: DC | PRN
  Administered 2022-09-07: 20 mg via INTRAVENOUS

## 2022-09-07 MED ORDER — PANTOPRAZOLE SODIUM 40 MG PO TBEC
40.0000 mg | DELAYED_RELEASE_TABLET | Freq: Every day | ORAL | 1 refills | Status: DC
  Filled 2022-09-07: qty 30, 30d supply, fill #0

## 2022-09-07 MED ORDER — LIDOCAINE HCL (PF) 1 % IJ SOLN
INTRAMUSCULAR | Status: DC | PRN
  Administered 2022-09-07: 2 mL

## 2022-09-07 MED ORDER — FENTANYL CITRATE (PF) 100 MCG/2ML IJ SOLN
INTRAMUSCULAR | Status: DC | PRN
  Administered 2022-09-07 (×2): 25 ug via INTRAVENOUS

## 2022-09-07 MED ORDER — VERAPAMIL HCL 2.5 MG/ML IV SOLN
INTRAVENOUS | Status: AC
  Filled 2022-09-07: qty 2

## 2022-09-07 MED ORDER — HYDRALAZINE HCL 20 MG/ML IJ SOLN: 10.0000 mg | INTRAMUSCULAR | Status: DC | PRN

## 2022-09-07 MED ORDER — ASPIRIN 81 MG PO CHEW: 81.0000 mg | CHEWABLE_TABLET | ORAL | Status: DC

## 2022-09-07 MED ORDER — ONDANSETRON HCL 4 MG/2ML IJ SOLN: 4.0000 mg | Freq: Four times a day (QID) | INTRAMUSCULAR | Status: DC | PRN

## 2022-09-07 MED ORDER — SODIUM CHLORIDE 0.9 % WEIGHT BASED INFUSION
3.0000 mL/kg/h | INTRAVENOUS | Status: AC
  Administered 2022-09-07: 3 mL/kg/h via INTRAVENOUS

## 2022-09-07 MED ORDER — MIDAZOLAM HCL 2 MG/2ML IJ SOLN
INTRAMUSCULAR | Status: DC | PRN
  Administered 2022-09-07: 2 mg via INTRAVENOUS

## 2022-09-07 MED ORDER — FAMOTIDINE IN NACL 20-0.9 MG/50ML-% IV SOLN
INTRAVENOUS | Status: AC
  Filled 2022-09-07: qty 50

## 2022-09-07 MED ORDER — HEPARIN (PORCINE) IN NACL 1000-0.9 UT/500ML-% IV SOLN
INTRAVENOUS | Status: DC | PRN
  Administered 2022-09-07 (×2): 500 mL

## 2022-09-07 SURGICAL SUPPLY — 20 items
BALLN EMERGE MR 2.5X15 (BALLOONS) ×1
BALLN ~~LOC~~ EMERGE MR 3.25X20 (BALLOONS) ×1
BALLOON EMERGE MR 2.5X15 (BALLOONS) IMPLANT
BALLOON ~~LOC~~ EMERGE MR 3.25X20 (BALLOONS) IMPLANT
CATH 5FR JL3.5 JR4 ANG PIG MP (CATHETERS) IMPLANT
CATH LAUNCHER 6FR EBU3.5 (CATHETERS) IMPLANT
DEVICE RAD COMP TR BAND LRG (VASCULAR PRODUCTS) IMPLANT
GLIDESHEATH SLEND SS 6F .021 (SHEATH) IMPLANT
GUIDEWIRE INQWIRE 1.5J.035X260 (WIRE) IMPLANT
INQWIRE 1.5J .035X260CM (WIRE) ×1
KIT ENCORE 26 ADVANTAGE (KITS) IMPLANT
KIT HEMO VALVE WATCHDOG (MISCELLANEOUS) IMPLANT
PACK CARDIAC CATHETERIZATION (CUSTOM PROCEDURE TRAY) ×2 IMPLANT
SET ATX-X65L (MISCELLANEOUS) IMPLANT
STENT SYNERGY XD 2.75X8 (Permanent Stent) IMPLANT
STENT SYNERGY XD 3.0X38 (Permanent Stent) IMPLANT
SYNERGY XD 2.75X8 (Permanent Stent) ×1 IMPLANT
SYNERGY XD 3.0X38 (Permanent Stent) ×1 IMPLANT
TUBING CIL FLEX 10 FLL-RA (TUBING) IMPLANT
WIRE RUNTHROUGH .014X180CM (WIRE) IMPLANT

## 2022-09-07 NOTE — Telephone Encounter (Signed)
   Transition of Care Follow-up Phone Call Request    Patient Name: Wayne Wheeler Date of Birth: 1956-01-11 Date of Encounter: 09/07/2022  Primary Care Provider:  Mliss Sax, MD Primary Cardiologist:  Rollene Rotunda, MD  [name] has been scheduled for a transition of care follow up appointment with a [facility] provider:  Marjie Skiff 9/9  Please reach out to [name] within 48 hours to confirm appointment and review transition of care protocol questionnaire.  Laverda Page, NP  09/07/2022, 2:57 PM

## 2022-09-07 NOTE — Interval H&P Note (Signed)
Cath Lab Visit (complete for each Cath Lab visit)  Clinical Evaluation Leading to the Procedure:   ACS: No.  Non-ACS:    Anginal Classification: CCS II  Anti-ischemic medical therapy: Minimal Therapy (1 class of medications)  Non-Invasive Test Results: Intermediate-risk stress test findings: cardiac mortality 1-3%/year  Prior CABG: No previous CABG  Abnormal CT with concern for LAD occlusion    History and Physical Interval Note:  09/07/2022 10:23 AM  Wayne Wheeler  has presented today for surgery, with the diagnosis of nstemi.  The various methods of treatment have been discussed with the patient and family. After consideration of risks, benefits and other options for treatment, the patient has consented to  Procedure(s): LEFT HEART CATH AND CORONARY ANGIOGRAPHY (N/A) as a surgical intervention.  The patient's history has been reviewed, patient examined, no change in status, stable for surgery.  I have reviewed the patient's chart and labs.  Questions were answered to the patient's satisfaction.     Lance Muss

## 2022-09-07 NOTE — Discharge Summary (Addendum)
Discharge Summary for Same Day PCI   Patient ID: Wayne Wheeler MRN: [id number]; DOB: Dec 23, 1955  Admit date: 09/07/2022 Discharge date: 09/07/2022  Primary Care Provider: Mliss Sax, MD  Primary Cardiologist: Rollene Rotunda, MD  Primary Electrophysiologist:  None   Discharge Diagnoses    Principal Problem:   Chest pain Active Problems:   Hyperlipidemia, unspecified  Diagnostic Studies/Procedures    Cardiac Catheterization 09/07/2022:    Mid LAD-1 lesion is 90% stenosed. Mid LAD-2 lesion is 70% stenosed. sequential.   A drug-eluting stent was successfully placed using a SYNERGY XD 3.0X38 postdilated to 3.25 mm.   A drug-eluting stent was successfully placed using a SYNERGY XD 2.75X8, overlapping. proximal stent.   Post intervention, there is a 0% residual stenosis.   The left ventricular systolic function is normal.   LV end diastolic pressure is normal.   The left ventricular ejection fraction is 55-65% by visual estimate.   There is no aortic valve stenosis.   There is no mitral valve regurgitation.   In the absence of any other complications or medical issues, we expect the patient to be ready for discharge from an interventional cardiology perspective on 09/07/2022.   Recommend uninterrupted dual antiplatelet therapy with Aspirin 81mg  daily and Clopidogrel 75mg  daily for a minimum of 6 months (stable ischemic heart disease-Class I recommendation).   Successful PCI of the long area of disease with overlapping LAD stents.  Continue aggressive secondary prevention.  Stressed the importance of DAPT.  Plan for same day discharge.   Diagnostic Dominance: Right  Intervention   _____________   History of Present Illness     Wayne Wheeler is a 67 y.o. male with PMH of HTN, HLD, preDM, tobacco use who presented to the office on 8/15 with Dr. Antoine Poche with complaints of chest pain. It was recommended that he undergo outpatient coronary CTA showing severe CAD in the  mLAD, and first diag, sent for FFR that noted possible total occlusion of mLAD. Given this finding, he was set up for outpatient cardiac cath.  Hospital Course     The patient underwent cardiac cath as noted above with 90% mLAD lesion treated successfully with PCI/DES (overlapping) to LAD. Plan for DAPT with ASA/plavix for at least 6 months. The patient was seen by cardiac rehab while in short stay. There were no observed complications post cath. Radial cath site was re-evaluated prior to discharge and found to be stable without any complications. Instructions/precautions regarding cath site care were given prior to discharge.  Wayne Wheeler was seen by Dr. Eldridge Dace and determined stable for discharge home. Follow up with our office has been arranged. Medications are listed below. Pertinent changes include addition of plavix, nitroglycerin, protonix. Discussed switching statin, but reports myalgias with Lipitor in the past, and prefers to stay on simvastatin     _____________  Cath/PCI Registry Performance & Quality Measures: Aspirin prescribed? - Yes ADP Receptor Inhibitor (Plavix/Clopidogrel, Brilinta/Ticagrelor or Effient/Prasugrel) prescribed (includes medically managed patients)? - Yes High Intensity Statin (Lipitor 40-80mg  or Crestor 20-40mg ) prescribed? - No - intolerant For EF <40%, was ACEI/ARB prescribed? - Not Applicable (EF >/= 40%) For EF <40%, Aldosterone Antagonist (Spironolactone or Eplerenone) prescribed? - Not Applicable (EF >/= 40%) Cardiac Rehab Phase II ordered (Included Medically managed Patients)? - Yes  _____________   Discharge Vitals Blood pressure 128/72, pulse 63, temperature 98.1 F (36.7 C), temperature source Temporal, resp. rate (!) 22, height 5\' 8"  (1.727 m), weight 98 kg, SpO2 96%.  Filed Weights   09/07/22 0845  Weight: 98 kg    Last Labs & Radiologic Studies    CBC Recent Labs    09/07/22 0835  WBC 9.7  HGB 14.8  HCT 44.0  MCV 97.3  PLT 176    Basic Metabolic Panel No results for input(s): "NA", "K", "CL", "CO2", "GLUCOSE", "BUN", "CREATININE", "CALCIUM", "MG", "PHOS" in the last 72 hours. Liver Function Tests No results for input(s): "AST", "ALT", "ALKPHOS", "BILITOT", "PROT", "ALBUMIN" in the last 72 hours. No results for input(s): "LIPASE", "AMYLASE" in the last 72 hours. High Sensitivity Troponin:   No results for input(s): "TROPONINIHS" in the last 720 hours.  BNP Invalid input(s): "POCBNP" D-Dimer No results for input(s): "DDIMER" in the last 72 hours. Hemoglobin A1C No results for input(s): "HGBA1C" in the last 72 hours. Fasting Lipid Panel No results for input(s): "CHOL", "HDL", "LDLCALC", "TRIG", "CHOLHDL", "LDLDIRECT" in the last 72 hours. Thyroid Function Tests No results for input(s): "TSH", "T4TOTAL", "T3FREE", "THYROIDAB" in the last 72 hours.  Invalid input(s): "FREET3" _____________  CARDIAC CATHETERIZATION  Result Date: 09/07/2022   Mid LAD-1 lesion is 90% stenosed. Mid LAD-2 lesion is 70% stenosed. sequential.   A drug-eluting stent was successfully placed using a SYNERGY XD 3.0X38 postdilated to 3.25 mm.   A drug-eluting stent was successfully placed using a SYNERGY XD 2.75X8, overlapping. proximal stent.   Post intervention, there is a 0% residual stenosis.   The left ventricular systolic function is normal.   LV end diastolic pressure is normal.   The left ventricular ejection fraction is 55-65% by visual estimate.   There is no aortic valve stenosis.   There is no mitral valve regurgitation.   In the absence of any other complications or medical issues, we expect the patient to be ready for discharge from an interventional cardiology perspective on 09/07/2022.   Recommend uninterrupted dual antiplatelet therapy with Aspirin 81mg  daily and Clopidogrel 75mg  daily for a minimum of 6 months (stable ischemic heart disease-Class I recommendation). Successful PCI of the long area of disease with overlapping LAD  stents. Continue aggressive secondary prevention.  Stressed the importance of DAPT.  Plan for same day discharge.   CT CORONARY FFR DATA PREP & FLUID ANALYSIS  Result Date: 09/01/2022 EXAM: CT FFR analysis was performed on the original cardiac CTA dataset. Diagrammatic representation of the CT FFR analysis is provided in a separate PDF document in PACS. This dictation was created using the PDF document and an interactive 3D model of the results. The 3D model is not available in the EMR/PACS. INTERPRETATION: CT FFR provides simultaneous calculation of pressure and flow across the entire coronary tree. For clinical decision making, CT FFR values should be obtained 1-2 cm distal to the lower border of each stenosis measured. Coronary CTA-related artifacts may impair the diagnostic accuracy of the original cardiac CTA and FFR CT results. *Due to the fact that CT FFR represents a mathematically-derived analysis, it is recommended that the results be interpreted as follows: 1. CT FFR >0.80: Low likelihood of hemodynamic significance. 2. CT FFR 0.76-0.80: Borderline likelihood of hemodynamic significance. 3. CT FFR =< 0.75: High likelihood of hemodynamic significance. *Coronary CT Angiography-derived Fractional Flow Reserve Testing in Patients with Stable Coronary Artery Disease: Recommendations on Interpretation and Reporting. Radiology: Cardiothoracic Imaging. 2019;1(5):e190050 FINDINGS: 1. Left Main: Low likelihood of hemodynamic significance. 2. LAD: Mid LAD is modeled as total occlusion. 3. D1: Low likelihood of hemodynamic significance, FFR beyond lesion is 0.84. 4. LCX: Low likelihood  of hemodynamic significance. 5. RCA: Low likelihood of hemodynamic significance. IMPRESSION: 1. CT FFR analysis modeled total occlusion of mid LAD lesion felt to be severe on coronary CT angiogram images. RECOMMENDATIONS: Consider cardiac catheterization to further evaluate mid LAD. Guideline directed medical therapy for secondary  prevention of CAD. Electronically Signed   By: Weston Brass M.D.   On: 09/01/2022 14:09   CT CORONARY MORPH W/CTA COR W/SCORE W/CA W/CM &/OR WO/CM  Result Date: 09/01/2022 HISTORY: Chest pain, nonspecific EXAM: Cardiac/Coronary  CT TECHNIQUE: The patient was scanned on a [employer]. PROTOCOL: A 120 kV prospective scan was triggered in the descending thoracic aorta at 111 HU's. Axial non-contrast 3 mm slices were carried out through the heart. The data set was analyzed on a dedicated work station and scored using the Agatston method. Gantry rotation speed was 250 msecs and collimation was .6 mm. Beta blockade and 0.8 mg of sl NTG was given. The 3D data set was reconstructed in 5% intervals of the 35-75 % of the R-R cycle. Systolic and diastolic phases were analyzed on a dedicated work station using MPR, MIP and VRT modes. The patient received contrast: 95mL OMNIPAQUE IOHEXOL 350 MG/ML SOLN. FINDINGS: Image quality: Good Noise artifact is: Mild misregistration Coronary calcium score is 970, which places the patient in the 90th percentile for age and sex matched control. Coronary arteries: Normal coronary origins.  Right dominance. Right Coronary Artery: Minimal mixed plaque in the proximal RCA and proximal PDA, <25% stenosis. Mild mixed plaque in the mid and distal RCA and proximal PLA, 25-49% stenosis. Left Main Coronary Artery: Mild mixed atherosclerotic plaque in the ostial LM, 25-49% stenosis. Left Anterior Descending Coronary Artery: Minimal mixed atherosclerotic plaque in the proxmial LAD, <25% stenosis. Severe mixed atherosclerotic plaque in the mid LAD, 70-99% stenosis. Severe calcified plaque in the mid first diagonal artery, 70-99% stenosis. Left Circumflex Artery: Minimal mixed atherosclerotic plaque in the proximal LCx, <25% stenosis. Aorta: Normal size, 34 mm at the mid ascending aorta (level of the PA bifurcation) measured double oblique. Aortic atherosclerosis. Aortic Valve: No  calcifications. Other findings: Normal pulmonary vein drainage into the left atrium. Normal left atrial appendage without thrombus. Normal size of the pulmonary artery. Please see separate report from [facility] for non-cardiac findings. IMPRESSION: 1. Severe CAD in the mid LAD and first diagonal artery, 70-99% stenosis, CADRADS 4. CT FFR will be performed and reported separately. 2. Total plaque volume 921 mm3 which is 71st percentile for age- and sex-matched controls (calcified plaque 143 mm3; non-calcified plaque 778 mm3). TPV is extensive. 3. Coronary calcium score is 970, which places the patient in the 90th percentile for age and sex matched control. 4. Normal coronary origins with right dominance. RECOMMENDATIONS: CAD-RADS 4 Severe stenosis. (70-99% or > 50% left main). Cardiac catheterization or CT FFR is recommended. Consider symptom-guided anti-ischemic pharmacotherapy as well as risk factor modification per guideline directed care. Electronically Signed   By: Weston Brass M.D.   On: 09/01/2022 14:02    Disposition   Pt is being discharged home today in good condition.  Follow-up Plans & Appointments     Follow-up Information     Corrin Parker, PA-C Follow up on 09/19/2022.   Specialty: Cardiology Why: at 10:55am for your follow up appt with cardiology Contact information: [address] [place] [address] [place] [address] [phone]                Discharge Instructions     Amb Referral  to Cardiac Rehabilitation   Complete by: As directed    Diagnosis:  Coronary Stents PTCA     After initial evaluation and assessments completed: Virtual Based Care may be provided alone or in conjunction with Phase 2 Cardiac Rehab based on patient barriers.: Yes   Intensive Cardiac Rehabilitation (ICR) [facility] location only OR Traditional Cardiac Rehabilitation (TCR) *If criteria for ICR are not met will enroll in TCR [facility] only): Yes        Discharge Medications    Allergies as of 09/07/2022       Reactions   Prednisone Other (See Comments)   Dyspepsia        Medication List     STOP taking these medications    esomeprazole 20 MG capsule Commonly known as: NEXIUM   metoprolol tartrate 25 MG tablet Commonly known as: LOPRESSOR       TAKE these medications    aspirin EC 81 MG tablet Take 81 mg by mouth daily. Swallow whole.   clopidogrel 75 MG tablet Commonly known as: PLAVIX Take 1 tablet (75 mg total) by mouth daily with breakfast. Start taking on: September 08, 2022   cyanocobalamin 1000 MCG tablet Take 1,000 mcg by mouth daily.   escitalopram 20 MG tablet Commonly known as: LEXAPRO Take 1 tablet (20 mg total) by mouth at bedtime.   gabapentin 300 MG capsule Commonly known as: NEURONTIN Take 1 capsule (300 mg total) by mouth 2 (two) times daily.   hydrochlorothiazide 25 MG tablet Commonly known as: HYDRODIURIL Take 1 tablet (25 mg total) by mouth daily.   lisinopril 40 MG tablet Commonly known as: ZESTRIL Take 1 tablet (40 mg total) by mouth daily.   nitroGLYCERIN 0.4 MG SL tablet Commonly known as: Nitrostat Place 1 tablet (0.4 mg total) under the tongue every 5 (five) minutes as needed.   pantoprazole 40 MG tablet Commonly known as: Protonix Take 1 tablet (40 mg total) by mouth daily.   simvastatin 40 MG tablet Commonly known as: ZOCOR Take 1 tablet (40 mg total) by mouth at bedtime.   varenicline 1 MG tablet Commonly known as: CHANTIX Take 1 tablet (1 mg total) by mouth 2 (two) times daily.           Allergies Allergies  Allergen Reactions   Prednisone Other (See Comments)    Dyspepsia    Outstanding Labs/Studies   N/a   Duration of Discharge Encounter   Greater than 30 minutes including physician time.  Signed, Laverda Page, NP 09/07/2022, 2:50 PM  I have examined the patient and reviewed assessment and plan and discussed with patient.  Agree with above as stated.    Successful  PCI of mid LAD with DES.  I stressed the importance of DAPT.  He needs aggressive secondary prevention as well. Plan for same day discharge.  Lance Muss

## 2022-09-07 NOTE — Telephone Encounter (Signed)
Patient is currently admitted

## 2022-09-07 NOTE — Progress Notes (Signed)
Discussed with pt stent, Plavix importance, restrictions, diet, smoking cessation, exercise, NTG and CRPII. Pt receptive. He is on Chantix but still smoking 1/2 ppd. Discussed tips and gave resources. Will refer to [place] CRPII.  [id number] Ethelda Chick BS, ACSM-CEP 09/07/2022 1:39 PM

## 2022-09-08 ENCOUNTER — Encounter (HOSPITAL_COMMUNITY): Payer: Self-pay | Admitting: Interventional Cardiology

## 2022-09-09 ENCOUNTER — Telehealth: Payer: Self-pay

## 2022-09-09 NOTE — Transitions of Care (Post Inpatient/ED Visit) (Signed)
   09/09/2022  Name: TAESHON MACKE MRN: [id number] DOB: 02-Sep-1955  Today's TOC FU Call Status: Today's TOC FU Call Status:: Successful TOC FU Call Completed TOC FU Call Complete Date: 09/09/22 Patient's Name and Date of Birth confirmed.  Transition Care Management Follow-up Telephone Call Date of Discharge: 09/07/22 Discharge Facility: [facility]) Type of Discharge:  (surgical) How have you been since you were released from the hospital?: Better Any questions or concerns?: No  Items Reviewed: Did you receive and understand the discharge instructions provided?: No Any new allergies since your discharge?: No Dietary orders reviewed?: No Do you have support at home?: Yes People in Home: spouse  Medications Reviewed Today: Medications Reviewed Today   Medications were not reviewed in this encounter     Home Care and Equipment/Supplies: Were Home Health Services Ordered?: NA Any new equipment or medical supplies ordered?: NA  Functional Questionnaire: Do you need assistance with bathing/showering or dressing?: No Do you need assistance with meal preparation?: No Do you need assistance with eating?: No Do you have difficulty maintaining continence: No Do you need assistance with getting out of bed/getting out of a chair/moving?: No Do you have difficulty managing or taking your medications?: No  Follow up appointments reviewed: Specialist Hospital Follow-up appointment confirmed?: Yes Date of Specialist follow-up appointment?: 09/19/22 Follow-Up Specialty Provider:: Dr. Loleta Chance Do you need transportation to your follow-up appointment?: No Do you understand care options if your condition(s) worsen?: Yes-patient verbalized understanding    SIGNATURE Arvil Persons, BSN, RN

## 2022-09-09 NOTE — Progress Notes (Deleted)
Cardiology Office Note:    Date:  09/09/2022   ID:  Wayne Wheeler, DOB [birth year], MRN [id number]  PCP:  Mliss Sax, MD  Cardiologist:  Rollene Rotunda, MD  Electrophysiologist:  None   Referring MD: Mliss Sax,*   Chief Complaint: follow-up of recent PCI  History of Present Illness:    Wayne Wheeler is a 67 y.o. male with a history of CAD s/p recent PCI with DES x2 to mid LAD on 09/07/2022, hypertension, hyperlipidemia, pre-diabetes, and tobacco abuse who is followed by Dr. Antoine Poche and presents today for follow-up of recent PCI.  Patient was recently referred to Dr. Antoine Poche on 08/25/2022 for further evaluation of chest pain. Coronary CTA was ordered for further evaluation and showed a coronary calcium score of 921 (71st percentile for age and sex) and severe CAD in the mid LAD and 1st Diag with 70-99% stenosis. FFR was suggestive of a total occlusion of the mid LAD. Therefore, he underwent cardiac catheterization on 09/07/2022 which showed tandem 90% and then 70% stenoses of the mid LAD but no other disease. He underwent successful PCI with DES x2 to the LAD.  Patient presents today for follow-up. ***  CAD Recent cardiac catheterization on 09/07/2022 showed tandem 90% and then 70% stenoses of the mid LAD but no other disease. He underwent successful PCI with DES x2 to the LAD. - No recurrent chest pain. *** - Continue DAPT with Aspirin 81mg  daily and Plavix 75mg  daily. - Continue statin. - Recommend Cardiac Rehab. ***  Hypertension BP *** - Continue current medications: Lisinopril 40mg  daily and HCTZ 25mg  daily.  Hyperlipidemia Lipid panel in 06/2022: Total Cholesterol 133, Triglycerides 127, HDL 31, LDL 76. LDL goal <70 given CAD. - Intolerant to Lipitor in the past and has been hesitant to retry a high-intensity statin. Continue Simvastatin 40mg  daily. - Will add Zetia 10mg  daily. *** - Will repeat lipid panel and LFTs in 2-3 months.  ***  Pre-Diabetes Hemoglobin A1c *** - Followed by PCP.  Tobacco Abuse ***  EKGs/Labs/Other Studies Reviewed:    The following studies were reviewed:  Coronary CTA 08/31/2022: Summary: 1. Severe CAD in the mid LAD and first diagonal artery, 70-99% stenosis, CADRADS 4. CT FFR will be performed and reported separately. 2. Total plaque volume 921 mm3 which is 71st percentile for age- and sex-matched controls (calcified plaque 143 mm3; non-calcified plaque 778 mm3). TPV is extensive. 3. Coronary calcium score is 970, which places the patient in the 90th percentile for age and sex matched control. 4. Normal coronary origins with right dominance.  FFR Summary: 1. CT FFR analysis modeled total occlusion of mid LAD lesion felt to be severe on coronary CT angiogram images _______________  Left Cardiac Catheterization 09/07/2022:   Mid LAD-1 lesion is 90% stenosed. Mid LAD-2 lesion is 70% stenosed. sequential.   A drug-eluting stent was successfully placed using a SYNERGY XD 3.0X38 postdilated to 3.25 mm.   A drug-eluting stent was successfully placed using a SYNERGY XD 2.75X8, overlapping. proximal stent.   Post intervention, there is a 0% residual stenosis.   The left ventricular systolic function is normal.   LV end diastolic pressure is normal.   The left ventricular ejection fraction is 55-65% by visual estimate.   There is no aortic valve stenosis.   There is no mitral valve regurgitation.   In the absence of any other complications or medical issues, we expect the patient to be ready for discharge from an interventional cardiology  perspective on 09/07/2022.   Recommend uninterrupted dual antiplatelet therapy with Aspirin 81mg  daily and Clopidogrel 75mg  daily for a minimum of 6 months (stable ischemic heart disease-Class I recommendation).   Successful PCI of the long area of disease with overlapping LAD stents.  Continue aggressive secondary prevention.  Stressed the importance  of DAPT.  Plan for same day discharge.  Diagnostic Dominance: Right  Intervention      EKG:  EKG ordered today. EKG personally reviewed and demonstrates ***.  Recent Labs: 06/30/2022: ALT 30; TSH 1.29 08/25/2022: BUN 10; Creatinine, Ser 1.01; Potassium 4.9; Sodium 137 09/07/2022: Hemoglobin 14.8; Platelets 176  Recent Lipid Panel    Component Value Date/Time   CHOL 133 06/30/2022 1521   TRIG 127.0 06/30/2022 1521   HDL 31.60 (L) 06/30/2022 1521   CHOLHDL 4 06/30/2022 1521   VLDL 25.4 06/30/2022 1521   LDLCALC 76 06/30/2022 1521   LDLCALC 66 05/15/2020 1451   LDLDIRECT 123.0 03/02/2021 1139    Physical Exam:    Vital Signs: There were no vitals taken for this visit.    Wt Readings from Last 3 Encounters:  09/07/22 216 lb (98 kg)  08/25/22 217 lb 9.6 oz (98.7 kg)  08/24/22 217 lb (98.4 kg)     General: 67 y.o. male in no acute distress. HEENT: Normocephalic and atraumatic. Sclera clear. EOMs intact. Neck: Supple. No carotid bruits. No JVD. Heart: *** RRR. Distinct S1 and S2. No murmurs, gallops, or rubs. Radial and distal pedal pulses 2+ and equal bilaterally. Lungs: No increased work of breathing. Clear to ausculation bilaterally. No wheezes, rhonchi, or rales.  Abdomen: Soft, non-distended, and non-tender to palpation. Bowel sounds present in all 4 quadrants.  MSK: Normal strength and tone for age. *** Extremities: No lower extremity edema.    Skin: Warm and dry. Neuro: Alert and oriented x3. No focal deficits. Psych: Normal affect. Responds appropriately.   Assessment:    No diagnosis found.  Plan:     Disposition: Follow up in ***   Medication Adjustments/Labs and Tests Ordered: Current medicines are reviewed at length with the patient today.  Concerns regarding medicines are outlined above.  No orders of the defined types were placed in this encounter.  No orders of the defined types were placed in this encounter.   There are no Patient  Instructions on file for this visit.   Signed, Corrin Parker, PA-C  09/09/2022 4:05 PM    [facility]

## 2022-09-09 NOTE — Telephone Encounter (Signed)
Called pt and LVM to return call to the office.

## 2022-09-13 NOTE — Telephone Encounter (Signed)
Second attempt to contact patient . Left detailed message for patient to return to call office.

## 2022-09-14 ENCOUNTER — Ambulatory Visit: Payer: Medicare HMO | Admitting: Pulmonary Disease

## 2022-09-14 ENCOUNTER — Telehealth (HOSPITAL_COMMUNITY): Payer: Self-pay

## 2022-09-14 ENCOUNTER — Encounter: Payer: Self-pay | Admitting: Pulmonary Disease

## 2022-09-14 ENCOUNTER — Other Ambulatory Visit (HOSPITAL_COMMUNITY): Payer: Self-pay

## 2022-09-14 VITALS — BP 120/64 | HR 80 | Temp 98.1°F | Ht 68.0 in | Wt 215.2 lb

## 2022-09-14 DIAGNOSIS — Z720 Tobacco use: Secondary | ICD-10-CM

## 2022-09-14 DIAGNOSIS — F17210 Nicotine dependence, cigarettes, uncomplicated: Secondary | ICD-10-CM | POA: Diagnosis not present

## 2022-09-14 DIAGNOSIS — J42 Unspecified chronic bronchitis: Secondary | ICD-10-CM

## 2022-09-14 LAB — CBC WITH DIFFERENTIAL/PLATELET
Basophils Absolute: 0 10*3/uL (ref 0.0–0.1)
Basophils Relative: 0.4 % (ref 0.0–3.0)
Eosinophils Absolute: 0.1 10*3/uL (ref 0.0–0.7)
Eosinophils Relative: 1.5 % (ref 0.0–5.0)
HCT: 42.9 % (ref 39.0–52.0)
Hemoglobin: 14.8 g/dL (ref 13.0–17.0)
Lymphocytes Relative: 22.2 % (ref 12.0–46.0)
Lymphs Abs: 1.8 10*3/uL (ref 0.7–4.0)
MCHC: 34.5 g/dL (ref 30.0–36.0)
MCV: 96.8 fl (ref 78.0–100.0)
Monocytes Absolute: 0.9 10*3/uL (ref 0.1–1.0)
Monocytes Relative: 10.9 % (ref 3.0–12.0)
Neutro Abs: 5.1 10*3/uL (ref 1.4–7.7)
Neutrophils Relative %: 65 % (ref 43.0–77.0)
Platelets: 148 10*3/uL — ABNORMAL LOW (ref 150.0–400.0)
RBC: 4.44 Mil/uL (ref 4.22–5.81)
RDW: 13 % (ref 11.5–15.5)
WBC: 7.9 10*3/uL (ref 4.0–10.5)

## 2022-09-14 MED ORDER — ANORO ELLIPTA 62.5-25 MCG/ACT IN AEPB: 1.0000 | INHALATION_SPRAY | Freq: Every day | RESPIRATORY_TRACT | 3 refills | Status: AC

## 2022-09-14 NOTE — Telephone Encounter (Signed)
Called pt in regards to scheduling cardiac orientation for after 9/9 due to follow up appointment. Pt stated it is a lot and is not interested in participating. Did express to pt he has a year from the date of event to use the referral if there is a change of mind. Will close the referral for now!

## 2022-09-14 NOTE — Patient Instructions (Addendum)
Use the nicotine patches with Chantix for smoking cessation Will start you on an inhaler called Stiolto Will check some labs today for monitoring Will schedule lung testing Referral for low-dose screening CT Follow-up in 3 months

## 2022-09-14 NOTE — Telephone Encounter (Signed)
Patient contacted regarding discharge from [facility] on 09/07/22.  Patient understands to follow up with provider  Ellin Goodie rich on September 9 at 10:55 at [facility] office Patient understands discharge instructions? States he didn't get any, but signed a lot of papers. Advised to look at the papers he was given when he went home and it will have his summary.  He will look through them today.  Patient understands medications and regiment? Yes Patient understands to bring all medications to this visit?Yes  Ask patient:  Are you enrolled in My Chart Yes Postop Surgical Patients:                What is your wound status?None               Do you have any questions about your medications?  None.  Are you taking your pain medication? No              How is your pain controlled? Pain level? No pain               Do you have help at home with ADL's?None needed  a lot of useful information in it that may answer any questions you may have.

## 2022-09-14 NOTE — Progress Notes (Signed)
Wayne Wheeler    [id number]    [birth year]  Primary Care Physician:Kremer, Talmadge Coventry, MD  Referring Physician: Mliss Sax, MD [address] [place],  [place] [address]  Chief complaint: Consult for COPD  HPI: 67 y.o. who  has a past medical history of Depression, Dyslipidemia, Hypertension, and Resting tremor.  Sent in for evaluation of COPD He has mild dyspnea on exertion with chest congestion.  No wheezing, mucus production  He had a recent CT coronaries which showed an LAD lesion and underwent cardiac catheterization on 09/07/2022 with stent placement.  Pets: Has a cat and a dog Occupation: Used to work in [employer] Exposures: No mold, hot tub, Financial controller.  No feather pillows or comforters Smoking history: 55-pack-year smoker.  Continues to smoke half pack per day Travel history: Originally from [place].  No significant recent travel Relevant family history: No family history of lung disease  Outpatient Encounter Medications as of 09/14/2022  Medication Sig   aspirin EC 81 MG tablet Take 81 mg by mouth daily. Swallow whole.   clopidogrel (PLAVIX) 75 MG tablet Take 1 tablet (75 mg total) by mouth daily with breakfast.   cyanocobalamin 1000 MCG tablet Take 1,000 mcg by mouth daily.   escitalopram (LEXAPRO) 20 MG tablet Take 1 tablet (20 mg total) by mouth at bedtime.   gabapentin (NEURONTIN) 300 MG capsule Take 1 capsule (300 mg total) by mouth 2 (two) times daily.   hydrochlorothiazide (HYDRODIURIL) 25 MG tablet Take 1 tablet (25 mg total) by mouth daily.   lisinopril (ZESTRIL) 40 MG tablet Take 1 tablet (40 mg total) by mouth daily.   nitroGLYCERIN (NITROSTAT) 0.4 MG SL tablet Place 1 tablet (0.4 mg total) under the tongue every 5 (five) minutes as needed.   pantoprazole (PROTONIX) 40 MG tablet Take 1 tablet (40 mg total) by mouth daily.   simvastatin (ZOCOR) 40 MG tablet Take 1 tablet (40 mg total) by mouth at bedtime.   varenicline  (CHANTIX) 1 MG tablet Take 1 tablet (1 mg total) by mouth 2 (two) times daily. (Patient not taking: Reported on 09/14/2022)   No facility-administered encounter medications on file as of 09/14/2022.    Allergies as of 09/14/2022 - Review Complete 09/14/2022  Allergen Reaction Noted   Prednisone Other (See Comments) 02/26/2015    Past Medical History:  Diagnosis Date   Depression    Dyslipidemia    Hypertension    Resting tremor     Past Surgical History:  Procedure Laterality Date   CORONARY STENT INTERVENTION N/A 09/07/2022   Procedure: CORONARY STENT INTERVENTION;  Surgeon: Corky Crafts, MD;  Location: [facility] INVASIVE CV LAB;  Service: Cardiovascular;  Laterality: N/A;   LEFT HEART CATH AND CORONARY ANGIOGRAPHY N/A 09/07/2022   Procedure: LEFT HEART CATH AND CORONARY ANGIOGRAPHY;  Surgeon: Corky Crafts, MD;  Location: [facility] INVASIVE CV LAB;  Service: Cardiovascular;  Laterality: N/A;   SHOULDER SURGERY Right 06/10/2009    Family History  Problem Relation Age of Onset   Depression Mother    Heart disease Father     Social History   Socioeconomic History   Marital status: Married    Spouse name: Not on file   Number of children: Not on file   Years of education: Not on file   Highest education level: Not on file  Occupational History   Not on file  Tobacco Use   Smoking status: Some Days  Current packs/day: 0.50    Types: Cigarettes   Smokeless tobacco: Never  Vaping Use   Vaping status: Never Used  Substance and Sexual Activity   Alcohol use: Yes    Alcohol/week: 1.0 standard drink of alcohol    Types: 1 Cans of beer per week    Comment: social   Drug use: Never   Sexual activity: Not on file  Other Topics Concern   Not on file  Social History Narrative   Are you right handed or left handed? 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 Yes.  Has a wife who stays with him at times.     Who lives with you?  Cat and  2 dog   What type of home do you live in: 1 story or 2 story? two    Caffeine  2 cups daily   Social Determinants of Health   Financial Resource Strain: Low Risk  (09/14/2021)   Overall Financial Resource Strain (CARDIA)    Difficulty of Paying Living Expenses: Not hard at all  Food Insecurity: No Food Insecurity (09/14/2021)   Hunger Vital Sign    Worried About Running Out of Food in the Last Year: Never true    Ran Out of Food in the Last Year: Never true  Transportation Needs: No Transportation Needs (09/14/2021)   PRAPARE - Administrator, Civil Service (Medical): No    Lack of Transportation (Non-Medical): No  Physical Activity: Sufficiently Active (09/14/2021)   Exercise Vital Sign    Days of Exercise per Week: 7 days    Minutes of Exercise per Session: 30 min  Stress: No Stress Concern Present (09/14/2021)   [employer] of Occupational Health - Occupational Stress Questionnaire    Feeling of Stress : Not at all  Social Connections: Socially Integrated (09/14/2021)   Social Connection and Isolation Panel [NHANES]    Frequency of Communication with Friends and Family: More than three times a week    Frequency of Social Gatherings with Friends and Family: More than three times a week    Attends Religious Services: More than 4 times per year    Active Member of [employer] or Organizations: Yes    Attends Engineer, structural: More than 4 times per year    Marital Status: Married  Catering manager Violence: Not At Risk (09/14/2021)   Humiliation, Afraid, Rape, and Kick questionnaire    Fear of Current or Ex-Partner: No    Emotionally Abused: No    Physically Abused: No    Sexually Abused: No    Review of systems: Review of Systems  Constitutional: Negative for fever and chills.  HENT: Negative.   Eyes: Negative for blurred vision.  Respiratory: as per HPI  Cardiovascular: Negative for chest pain and palpitations.  Gastrointestinal: Negative for  vomiting, diarrhea, blood per rectum. Genitourinary: Negative for dysuria, urgency, frequency and hematuria.  Musculoskeletal: Negative for myalgias, back pain and joint pain.  Skin: Negative for itching and rash.  Neurological: Negative for dizziness, tremors, focal weakness, seizures and loss of consciousness.  Endo/Heme/Allergies: Negative for environmental allergies.  Psychiatric/Behavioral: Negative for depression, suicidal ideas and hallucinations.  All other systems reviewed and are negative.  Physical Exam: Blood pressure 120/64, pulse 80, temperature 98.1 F (36.7 C), temperature source Oral, height 5\' 8"  (1.727 m), weight 215 lb 3.2 oz (97.6 kg), SpO2 96%. Gen:      No acute distress  HEENT:  EOMI, sclera anicteric Neck:     No masses; no thyromegaly Lungs:    Clear to auscultation bilaterally; normal respiratory effort CV:         Regular rate and rhythm; no murmurs Abd:      + bowel sounds; soft, non-tender; no palpable masses, no distension Ext:    No edema; adequate peripheral perfusion Skin:      Warm and dry; no rash Neuro: alert and oriented x 3 Psych: normal mood and affect  Data Reviewed: Imaging: CT coronaries 08/31/2022-severe coronary artery disease, visualized lungs are unremarkable I have reviewed the images personally  PFTs: 09/14/22 FVC 3.41 [81%], FEV1 2.69 [86%], F/F79 Normal spirometry  Labs:  Assessment:  Assessment for dyspnea Likely has COPD based on smoking history but spirometry today seems normal We will start him on Stiolto inhaler Check CBC differential, IgE, alpha-1 antitrypsin levels and phenotype Schedule full PFTs  Active smoker Low-dose screening CTs of the chest Smoking cessation discussed in detail with patient He is going to use nicotine gum with Chantix Time spent counseling-5 minutes.  Reassess at return visit  Plan/Recommendations: Stiolto CBC, IgE, alpha-1 antitrypsin PFTs Order screening CT of chest Smoking  cessation  Chilton Greathouse MD [facility] 09/14/2022, 1:54 PM  CC: Mliss Sax,*

## 2022-09-16 ENCOUNTER — Other Ambulatory Visit: Payer: Self-pay | Admitting: Family Medicine

## 2022-09-16 DIAGNOSIS — I10 Essential (primary) hypertension: Secondary | ICD-10-CM

## 2022-09-18 NOTE — Progress Notes (Unsigned)
Cardiology Office Note:  .   Date:  09/19/2022  ID:  Wayne Wheeler, DOB Oct 21, 1955, MRN [id number] PCP: Wayne Sax, MD  [facility] Providers Cardiologist:  Rollene Rotunda, MD    History of Present Illness: .   Wayne Wheeler is a 67 y.o. male with past medical history of coronary artery disease s/p PCI with DES x 2 to mid LAD on 09/07/2022, hyperlipidemia, hypertension, prediabetes and tobacco abuse.  He is followed by Dr. Antoine Poche.  He presents today for follow-up regarding recent PCI.  Wayne Wheeler was first evaluated by Dr. Antoine Poche on 05/25/2022 for chest pain.  Neri CTA showed a coronary calcium score of 921, 71st percentile for age and sex, and severe CAD in the mid LAD and first diagonal with 70 to 99% stenosis.  FFR was suggestive of a total occlusion of the mid LAD.  He underwent cardiac catheterization on 09/07/2022 which showed tandem 90% and then 70% stenosis of the mid LAD but no other disease.  He underwent successful PCI with DES x 2 to the LAD.    Today he presents for follow up. He reports that he is doing well, he denies chest pain and shortness of breath.  He denies palpitations, lower extremity edema, PND or orthopnea.  He has been working to stop smoking, he is currently using Chantix which he has been using for the past few months, currently smoking 3/4 pack a day. Notes he has not increased his activity much but has tolerated walking around outside.   WUJ:WJXBJ he denies chest pain, shortness of breath, lower extremity edema, fatigue, palpitations, melena, hematuria, hemoptysis, diaphoresis, weakness, presyncope, syncope, orthopnea, and PND.   Studies Reviewed: Marland Kitchen   EKG Interpretation Date/Time:  Monday September 19 2022 10:57:41 EDT Ventricular Rate:  74 PR Interval:    QRS Duration:  98 QT Interval:  420 QTC Calculation: 466 R Axis:   23  Text Interpretation: Sinus rhythm with PACs in pattern of bigeminy with occasional Premature ventricular complexes  Nonspecific T wave abnormality Confirmed by Reather Littler [phone]) on 09/19/2022 11:35:45 AM Cardiac Studies & Procedures   CARDIAC CATHETERIZATION  CARDIAC CATHETERIZATION 09/07/2022  Narrative   Mid LAD-1 lesion is 90% stenosed. Mid LAD-2 lesion is 70% stenosed. sequential.   A drug-eluting stent was successfully placed using a SYNERGY XD 3.0X38 postdilated to 3.25 mm.   A drug-eluting stent was successfully placed using a SYNERGY XD 2.75X8, overlapping. proximal stent.   Post intervention, there is a 0% residual stenosis.   The left ventricular systolic function is normal.   LV end diastolic pressure is normal.   The left ventricular ejection fraction is 55-65% by visual estimate.   There is no aortic valve stenosis.   There is no mitral valve regurgitation.   In the absence of any other complications or medical issues, we expect the patient to be ready for discharge from an interventional cardiology perspective on 09/07/2022.   Recommend uninterrupted dual antiplatelet therapy with Aspirin 81mg  daily and Clopidogrel 75mg  daily for a minimum of 6 months (stable ischemic heart disease-Class I recommendation).  Successful PCI of the long area of disease with overlapping LAD stents. Continue aggressive secondary prevention.  Stressed the importance of DAPT.  Plan for same day discharge.  Findings Coronary Findings Diagnostic  Dominance: Right  Left Anterior Descending Mid LAD-1 lesion is 90% stenosed. Mid LAD-2 lesion is 70% stenosed.  Intervention  Mid LAD-1 lesion Stent CATH LAUNCHER 6FR EBU3.5 guide catheter was inserted.  Lesion crossed with guidewire using a WIRE RUNTHROUGH .[id number]. Pre-stent angioplasty was performed using a BALLN EMERGE MR 2.5X15. A drug-eluting stent was successfully placed using a SYNERGY XD 3.0X38. Stent strut is well apposed. Post-stent angioplasty was performed using a BALLN [place] EMERGE MR 3.25X20. Post-Intervention Lesion Assessment The intervention was  successful. Pre-interventional TIMI flow is 3. Post-intervention TIMI flow is 3. No complications occurred at this lesion. There is a 0% residual stenosis post intervention.  Mid LAD-2 lesion Stent CATH LAUNCHER 6FR EBU3.5 guide catheter was inserted. Lesion crossed with guidewire using a WIRE RUNTHROUGH .[id number]. Pre-stent angioplasty was performed using a BALLN [place] EMERGE MR 3.25X20. A drug-eluting stent was successfully placed using a SYNERGY XD 2.75X8. Stent strut is well apposed. Post-stent angioplasty was performed using a BALLN  EMERGE MR 3.25X20. Post-Intervention Lesion Assessment The intervention was successful. Pre-interventional TIMI flow is 3. Post-intervention TIMI flow is 3. No complications occurred at this lesion. There is a 0% residual stenosis post intervention.        CT SCANS  CT CORONARY MORPH W/CTA COR W/SCORE 08/31/2022  Addendum 09/08/2022 12:04 PM ADDENDUM REPORT: 09/08/2022 12:01  EXAM: OVER-READ INTERPRETATION  CT CHEST  The following report is an over-read performed by radiologist Dr. Curly Shores [facility] on 09/08/2022. This over-read does not include interpretation of cardiac or coronary anatomy or pathology. The coronary CTA interpretation by the cardiologist is attached.  COMPARISON:  None.  FINDINGS: Cardiovascular:  Findings discussed in the body of the report.  Mediastinum/Nodes: No suspicious adenopathy identified. Imaged mediastinal structures are unremarkable.  Lungs/Pleura: Imaged lungs are clear. No pleural effusion or pneumothorax.  Upper Abdomen: No acute abnormality.  Musculoskeletal: No chest wall abnormality. There are thoracic degenerative changes.  IMPRESSION: No significant extracardiac incidental findings identified.   Electronically Signed By: Layla Maw M.D. On: 09/08/2022 12:01  Narrative HISTORY: Chest pain, nonspecific  EXAM: Cardiac/Coronary  CT  TECHNIQUE: The patient was  scanned on a [employer].  PROTOCOL: A 120 kV prospective scan was triggered in the descending thoracic aorta at 111 HU's. Axial non-contrast 3 mm slices were carried out through the heart. The data set was analyzed on a dedicated work station and scored using the Agatston method. Gantry rotation speed was 250 msecs and collimation was .6 mm. Beta blockade and 0.8 mg of sl NTG was given. The 3D data set was reconstructed in 5% intervals of the 35-75 % of the R-R cycle. Systolic and diastolic phases were analyzed on a dedicated work station using MPR, MIP and VRT modes. The patient received contrast: 95mL OMNIPAQUE IOHEXOL 350 MG/ML SOLN.  FINDINGS: Image quality: Good  Noise artifact is: Mild misregistration  Coronary calcium score is 970, which places the patient in the 90th percentile for age and sex matched control.  Coronary arteries: Normal coronary origins.  Right dominance.  Right Coronary Artery: Minimal mixed plaque in the proximal RCA and proximal PDA, <25% stenosis. Mild mixed plaque in the mid and distal RCA and proximal PLA, 25-49% stenosis.  Left Main Coronary Artery: Mild mixed atherosclerotic plaque in the ostial LM, 25-49% stenosis.  Left Anterior Descending Coronary Artery: Minimal mixed atherosclerotic plaque in the proxmial LAD, <25% stenosis. Severe mixed atherosclerotic plaque in the mid LAD, 70-99% stenosis. Severe calcified plaque in the mid first diagonal artery, 70-99% stenosis.  Left Circumflex Artery: Minimal mixed atherosclerotic plaque in the proximal LCx, <25% stenosis.  Aorta: Normal size, 34 mm at the mid ascending aorta (level of the PA  bifurcation) measured double oblique. Aortic atherosclerosis.  Aortic Valve: No calcifications.  Other findings:  Normal pulmonary vein drainage into the left atrium.  Normal left atrial appendage without thrombus.  Normal size of the pulmonary artery.  Please see separate report from  [facility] for non-cardiac findings.  IMPRESSION: 1. Severe CAD in the mid LAD and first diagonal artery, 70-99% stenosis, CADRADS 4. CT FFR will be performed and reported separately.  2. Total plaque volume 921 mm3 which is 71st percentile for age- and sex-matched controls (calcified plaque 143 mm3; non-calcified plaque 778 mm3). TPV is extensive.  3. Coronary calcium score is 970, which places the patient in the 90th percentile for age and sex matched control.  4. Normal coronary origins with right dominance.  RECOMMENDATIONS: CAD-RADS 4 Severe stenosis. (70-99% or > 50% left main). Cardiac catheterization or CT FFR is recommended. Consider symptom-guided anti-ischemic pharmacotherapy as well as risk factor modification per guideline directed care.  Electronically Signed: By: Weston Brass M.D. On: 09/01/2022 14:02                   Physical Exam:   VS:  BP 120/70   Pulse 74   Ht 5\' 8"  (1.727 m)   Wt 214 lb (97.1 kg)   SpO2 99%   BMI 32.54 kg/m    Wt Readings from Last 3 Encounters:  09/19/22 214 lb (97.1 kg)  09/14/22 215 lb 3.2 oz (97.6 kg)  09/07/22 216 lb (98 kg)    GEN: Well nourished, well developed in no acute distress NECK: No JVD; No carotid bruits CARDIAC: RRR, no murmurs, rubs, gallops RESPIRATORY:  Clear to auscultation without rales, wheezing or rhonchi  ABDOMEN: Soft, non-tender, non-distended EXTREMITIES:  No edema; No deformity     ASSESSMENT AND PLAN: .   CAD: Wayne Wheeler recently underwent cardiac cath on 09/07/22 which showed tandem 90% then 70% stenoses of the mid LAD, no other disease. He had successful PCI with DES x2 to the LAD. Stable with no anginal symptoms. Heart healthy diet and regular cardiovascular exercise encouraged.  EKG today indicates sinus rhythm with PACs in pattern of bigeminy, with rare PVC, nonspecific T waves are unchanged compared to prior. Right radial cath site is clean and intact, no evidence of hematoma.   He may start cardiac rehab.  Continue DAPT with aspirin 81 mg daily and Plavix 75 mg daily.  Continue hydrochlorothiazide 25 mg daily, lisinopril 40 mg daily and Simvastatin 40 mg daily. Start Ezetimibe 10 mg daily.   Frequent PACs: EKG today indicates sinus rhythm with PACs in pattern of bigeminy, with rare PVC. He is asymptomatic and denies palpitations, on auscultation rhythm was more regular compared to EKG tracing. Will defer beta blocker therapy for now, will have close monitoring at cardiac rehab. Given increased ectopy will check BMET and magnesium level today.   Hypertension: Blood pressure well controlled today at 120/70. Continue lisinopril 40 mg daily and HCTZ 25 mg daily.  Hyperlipidemia: Last lipid profile in 06/2022 indicated total cholesterol 133, triglycerides 127, HDL 31 and LDL 76.  His LDL goal is less than 70 given CAD.  He has been intolerant to Lipitor in the past, he has been hesitant to retry a high intensity statin.  Will continue simvastatin 40 mg daily.  Start Zetia 10 mg daily.  Repeat fasting lipid profile and LFTs in 2 to 3 months.  Tobacco use: Reports that he currently smokes 3/4 of a pack a day.  Taking Chantix,  started three months ago. Followed by pulmonology.        Dispo: Follow up with Reather Littler, NP or Marjie Skiff, PA in 3 months.   Signed, Rip Harbour, NP

## 2022-09-19 ENCOUNTER — Ambulatory Visit: Payer: Medicare HMO | Attending: Student | Admitting: Cardiology

## 2022-09-19 ENCOUNTER — Ambulatory Visit: Payer: Medicare HMO | Admitting: Neurology

## 2022-09-19 ENCOUNTER — Encounter: Payer: Self-pay | Admitting: Student

## 2022-09-19 ENCOUNTER — Telehealth: Payer: Self-pay | Admitting: Neurology

## 2022-09-19 VITALS — BP 120/70 | HR 74 | Ht 68.0 in | Wt 214.0 lb

## 2022-09-19 DIAGNOSIS — Z720 Tobacco use: Secondary | ICD-10-CM

## 2022-09-19 DIAGNOSIS — I491 Atrial premature depolarization: Secondary | ICD-10-CM

## 2022-09-19 DIAGNOSIS — I10 Essential (primary) hypertension: Secondary | ICD-10-CM | POA: Diagnosis not present

## 2022-09-19 DIAGNOSIS — R7303 Prediabetes: Secondary | ICD-10-CM

## 2022-09-19 DIAGNOSIS — I2510 Atherosclerotic heart disease of native coronary artery without angina pectoris: Secondary | ICD-10-CM | POA: Diagnosis not present

## 2022-09-19 DIAGNOSIS — M79601 Pain in right arm: Secondary | ICD-10-CM | POA: Diagnosis not present

## 2022-09-19 DIAGNOSIS — G5601 Carpal tunnel syndrome, right upper limb: Secondary | ICD-10-CM

## 2022-09-19 DIAGNOSIS — M542 Cervicalgia: Secondary | ICD-10-CM

## 2022-09-19 DIAGNOSIS — G250 Essential tremor: Secondary | ICD-10-CM

## 2022-09-19 DIAGNOSIS — E782 Mixed hyperlipidemia: Secondary | ICD-10-CM

## 2022-09-19 MED ORDER — CLOPIDOGREL BISULFATE 75 MG PO TABS
75.0000 mg | ORAL_TABLET | Freq: Every day | ORAL | 2 refills | Status: DC
Start: 2022-09-19 — End: 2023-09-20

## 2022-09-19 MED ORDER — EZETIMIBE 10 MG PO TABS
10.0000 mg | ORAL_TABLET | Freq: Every day | ORAL | 3 refills | Status: DC
Start: 2022-09-19 — End: 2023-09-20

## 2022-09-19 NOTE — Procedures (Signed)
[facility]  [address] [place], [address]  [place], [place] [address] Tel: [phone] Fax: [phone] Test Date:  09/19/2022  Patient: Wayne Wheeler DOB: 1955-03-26 Physician: Jacquelyne Balint, MD  Sex: Male Height: 5\' 8"  Ref Phys: Jacquelyne Balint, MD  ID#: [id number]   Technician:    History: This is a 67 year old male with right arm pain.  NCV & EMG Findings: Extensive electrodiagnostic evaluation of the right upper limb shows: Right median-ulnar palmar sensory responses show abnormal peak latency difference ((Median Palm-Wrist)-(Ulnar Palm-Wrist), 0.45 ms). Right median, ulnar, and radial sensory responses are within normal limits. Right median (APB) and ulnar (ADM) motor responses are within normal limits. There is no evidence of active or chronic motor axon loss changes affecting any of the tested muscles on needle examination. Motor unit configuration and recruitment pattern is within normal limits.  Impression: This is an abnormal study. The findings are most consistent with the following: Evidence of a right median mononeuropathy at or distal to the wrist, consistent with carpal tunnel syndrome, very mild in degree electrically. No definitive electrodiagnostic evidence of a right cervical (C5-T1) motor radiculopathy. Screening studies for right ulnar or radial mononeuropathies are normal.    ___________________________ Jacquelyne Balint, MD    Nerve Conduction Studies Motor Nerve Results    Latency Amplitude F-Lat Segment Distance CV Comment  Site (ms) Norm (mV) Norm (ms)  (cm) (m/s) Norm   Right Median (APB) Motor  Wrist 3.8  < 4.0 5.5  > 5.0        Elbow 9.3 - 5.2 -  Elbow-Wrist 28 51  > 50   Right Ulnar (ADM) Motor  Wrist 1.55  < 3.1 9.5  > 7.0        Bel elbow 5.4 - 8.8 -  Bel elbow-Wrist 22 56  > 50   Ab elbow 7.1 - 8.2 -  Ab elbow-Bel elbow 10 59 -    Sensory Sites    Neg Peak Lat Amplitude (O-P) Segment Distance Velocity Comment  Site (ms) Norm (V) Norm   (cm) (ms)   Right Median Sensory  Wrist-Dig II 3.4  < 3.8 20  > 10 Wrist-Dig II 13    Right Median-Ulnar Palmar Sensory       Median  Palm-Wrist 2.2  < 2.2 28  > 10 Palm-Wrist 8         Ulnar  Palm-Wrist 1.75  < 2.2 23  > 5 Palm-Wrist 8    Right Radial Sensory  Forearm-Wrist 2.1  < 2.8 12  > 10 Forearm-Wrist 10    Right Ulnar Sensory  Wrist-Dig V 2.9  < 3.2 17  > 5 Wrist-Dig V 11     Inter-Nerve Comparisons   Nerve 1 Value 1 Nerve 2 Value 2 Parameter Result Normal  Sensory Sites  R Median Palm-Wrist 2.2 ms R Ulnar Palm-Wrist 1.75 ms Peak Lat Diff *0.45 ms <0.40   Electromyography   Side Muscle Ins.Act Fibs Fasc Recrt Amp Dur Poly Activation Comment  Right FDI Nml Nml Nml Nml Nml Nml Nml Nml N/A  Right EIP Nml Nml Nml Nml Nml Nml Nml Nml N/A  Right FPL Nml Nml Nml Nml Nml Nml Nml Nml N/A  Right APB Nml Nml Nml Nml Nml Nml Nml Nml N/A  Right Pronator teres Nml Nml Nml Nml Nml Nml Nml Nml N/A  Right Biceps Nml Nml Nml Nml Nml Nml Nml Nml N/A  Right Triceps Nml Nml Nml Nml Nml Nml Nml Nml  N/A  Right Deltoid Nml Nml Nml Nml Nml Nml Nml Nml N/A  Right C7 PSP Nml Nml Nml Nml Nml Nml Nml Nml N/A      Waveforms:  Motor      Sensory

## 2022-09-19 NOTE — Telephone Encounter (Signed)
Discussed the results of patient's EMG after the procedure today. It showed very mild right carpal tunnel syndrome, but no definitive evidence of cervical radiculopathy or ulnar neuropathy at the elbow.  Patient states symptoms have improved some since starting gabapentin (pain and tremor). He will continue 300 mg BID for now and follow up as planned on 12/15/22.  All questions were answered.  Jacquelyne Balint, MD [facility]

## 2022-09-19 NOTE — Patient Instructions (Signed)
Medication Instructions:  Your physician has recommended you make the following change in your medication:  1) START taking Zetia (ezetimibe) 10 mg daily  *If you need a refill on your cardiac medications before your next appointment, please call your pharmacy*  Lab Work: TODAY: BMET and Mag IN THREE MONTHS: Fasting lipids and liver panel  Follow-Up: At [facility], you and your health needs are our priority.  As part of our continuing mission to provide you with exceptional heart care, we have created designated Provider Care Teams.  These Care Teams include your primary Cardiologist (physician) and Advanced Practice Providers (APPs -  Physician Assistants and Nurse Practitioners) who all work together to provide you with the care you need, when you need it.  Your next appointment:   3 months  Provider:   Reather Littler, NP

## 2022-09-20 LAB — BASIC METABOLIC PANEL
BUN/Creatinine Ratio: 15 (ref 10–24)
BUN: 14 mg/dL (ref 8–27)
CO2: 24 mmol/L (ref 20–29)
Calcium: 9.4 mg/dL (ref 8.6–10.2)
Chloride: 99 mmol/L (ref 96–106)
Creatinine, Ser: 0.95 mg/dL (ref 0.76–1.27)
Glucose: 144 mg/dL — ABNORMAL HIGH (ref 70–99)
Potassium: 4.1 mmol/L (ref 3.5–5.2)
Sodium: 137 mmol/L (ref 134–144)
eGFR: 88 mL/min/{1.73_m2} (ref >59)

## 2022-09-20 LAB — MAGNESIUM: Magnesium: 2 mg/dL (ref 1.6–2.3)

## 2022-09-23 LAB — IGE: IgE (Immunoglobulin E), Serum: 24 kU/L (ref <=114)

## 2022-09-23 LAB — ALPHA-1 ANTITRYPSIN PHENOTYPE: A-1 Antitrypsin, Ser: 164 mg/dL (ref 83–199)

## 2022-09-24 ENCOUNTER — Encounter: Payer: Self-pay | Admitting: Pulmonary Disease

## 2022-09-24 ENCOUNTER — Other Ambulatory Visit: Payer: Self-pay | Admitting: Family Medicine

## 2022-09-24 DIAGNOSIS — E7800 Pure hypercholesterolemia, unspecified: Secondary | ICD-10-CM

## 2022-09-24 LAB — PULMONARY FUNCTION TEST
FEF 25-75 Pre: 2.42 L/sec
FEF2575-%Pred-Pre: 99 %
FEV1-%Pred-Pre: 86 %
FEV1-Pre: 2.69 L
FEV1FVC-%Pred-Pre: 106 %
FEV6-%Pred-Pre: 84 %
FEV6-Pre: 3.36 L
FEV6FVC-%Pred-Pre: 104 %
FVC-%Pred-Pre: 81 %
Pre FEV1/FVC ratio: 79 %
Pre FEV6/FVC Ratio: 99 %

## 2022-09-26 ENCOUNTER — Ambulatory Visit (INDEPENDENT_AMBULATORY_CARE_PROVIDER_SITE_OTHER): Payer: Medicare HMO

## 2022-09-26 DIAGNOSIS — Z0000 Encounter for general adult medical examination without abnormal findings: Secondary | ICD-10-CM

## 2022-09-26 NOTE — Patient Instructions (Signed)
Wayne Wheeler , Thank you for taking time to come for your Medicare Wellness Visit. I appreciate your ongoing commitment to your health goals. Please review the following plan we discussed and let me know if I can assist you in the future.   Referrals/Orders/Follow-Ups/Clinician Recommendations: none  This is a list of the screening recommended for you and due dates:  Health Maintenance  Topic Date Due   Colon Cancer Screening  07/09/2020   Flu Shot  08/11/2022   Medicare Annual Wellness Visit  09/26/2023   DTaP/Tdap/Td vaccine (3 - Td or Tdap) 03/03/2031   Pneumonia Vaccine  Completed   Hepatitis C Screening  Completed   Zoster (Shingles) Vaccine  Completed   HPV Vaccine  Aged Out   COVID-19 Vaccine  Discontinued    Advanced directives: (ACP Link)Information on Advanced Care Planning can be found at [facility] [place] Advance Health Care Directives Advance Health Care Directives ([email or web address]   Next Medicare Annual Wellness Visit scheduled for next year: Yes  insert Preventive Care attachment Insert FALL PREVENTION attachment if needed

## 2022-09-26 NOTE — Progress Notes (Signed)
Subjective:   Wayne Wheeler is a 67 y.o. male who presents for Medicare Annual/Subsequent preventive examination.  Visit Complete: Virtual  I connected with  [name] on 09/26/22 by a audio enabled telemedicine application and verified that I am speaking with the correct person using two identifiers.  Patient Location: Home  Provider Location: Office/Clinic  I discussed the limitations of evaluation and management by telemedicine. The patient expressed understanding and agreed to proceed.  Vital Signs: Unable to obtain new vitals due to this being a telehealth visit.  Cardiac Risk Factors include: advanced age (>65men, >18 women);dyslipidemia;hypertension;male gender     Objective:    Today's Vitals   There is no height or weight on file to calculate BMI.     09/26/2022   11:38 AM 09/07/2022    8:44 AM 08/24/2022    3:04 PM 09/14/2021    2:40 PM  Advanced Directives  Does Patient Have a Medical Advance Directive? No No No No  Would patient like information on creating a medical advance directive?  Yes (MAU/Ambulatory/Procedural Areas - Information given)  No - Patient declined    Current Medications (verified) Outpatient Encounter Medications as of 09/26/2022  Medication Sig   aspirin EC 81 MG tablet Take 81 mg by mouth daily. Swallow whole.   clopidogrel (PLAVIX) 75 MG tablet Take 1 tablet (75 mg total) by mouth daily with breakfast.   cyanocobalamin 1000 MCG tablet Take 1,000 mcg by mouth daily.   escitalopram (LEXAPRO) 20 MG tablet Take 1 tablet (20 mg total) by mouth at bedtime.   ezetimibe (ZETIA) 10 MG tablet Take 1 tablet (10 mg total) by mouth daily.   gabapentin (NEURONTIN) 300 MG capsule Take 1 capsule (300 mg total) by mouth 2 (two) times daily.   hydrochlorothiazide (HYDRODIURIL) 25 MG tablet Take 1 tablet by mouth once daily   lisinopril (ZESTRIL) 40 MG tablet Take 1 tablet by mouth once daily   nitroGLYCERIN (NITROSTAT) 0.4 MG SL tablet Place 1 tablet  (0.4 mg total) under the tongue every 5 (five) minutes as needed.   pantoprazole (PROTONIX) 40 MG tablet Take 1 tablet (40 mg total) by mouth daily.   simvastatin (ZOCOR) 40 MG tablet TAKE 1 TABLET BY MOUTH AT BEDTIME   umeclidinium-vilanterol (ANORO ELLIPTA) 62.5-25 MCG/ACT AEPB Inhale 1 puff into the lungs daily.   varenicline (CHANTIX) 1 MG tablet Take 1 tablet (1 mg total) by mouth 2 (two) times daily.   No facility-administered encounter medications on file as of 09/26/2022.    Allergies (verified) Prednisone   History: Past Medical History:  Diagnosis Date   Depression    Dyslipidemia    Hypertension    Resting tremor    Past Surgical History:  Procedure Laterality Date   CORONARY STENT INTERVENTION N/A 09/07/2022   Procedure: CORONARY STENT INTERVENTION;  Surgeon: Corky Crafts, MD;  Location: [facility] INVASIVE CV LAB;  Service: Cardiovascular;  Laterality: N/A;   LEFT HEART CATH AND CORONARY ANGIOGRAPHY N/A 09/07/2022   Procedure: LEFT HEART CATH AND CORONARY ANGIOGRAPHY;  Surgeon: Corky Crafts, MD;  Location: [facility] INVASIVE CV LAB;  Service: Cardiovascular;  Laterality: N/A;   SHOULDER SURGERY Right 06/10/2009   Family History  Problem Relation Age of Onset   Depression Mother    Heart disease Father    Social History   Socioeconomic History   Marital status: Married    Spouse name: Not on file   Number of children: Not on file   Years  of education: Not on file   Highest education level: Not on file  Occupational History   Not on file  Tobacco Use   Smoking status: Some Days    Current packs/day: 0.50    Types: Cigarettes   Smokeless tobacco: Never  Vaping Use   Vaping status: Never Used  Substance and Sexual Activity   Alcohol use: Yes    Alcohol/week: 1.0 standard drink of alcohol    Types: 1 Cans of beer per week    Comment: social   Drug use: Never   Sexual activity: Not on file  Other Topics Concern   Not on file  Social History Narrative    Are you right handed or left handed? 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 Yes.  Has a wife who stays with him at times.     Who lives with you? Cat and  2 dog   What type of home do you live in: 1 story or 2 story? two    Caffeine  2 cups daily   Social Determinants of Health   Financial Resource Strain: Low Risk  (09/26/2022)   Overall Financial Resource Strain (CARDIA)    Difficulty of Paying Living Expenses: Not hard at all  Food Insecurity: No Food Insecurity (09/26/2022)   Hunger Vital Sign    Worried About Running Out of Food in the Last Year: Never true    Ran Out of Food in the Last Year: Never true  Transportation Needs: No Transportation Needs (09/26/2022)   PRAPARE - Administrator, Civil Service (Medical): No    Lack of Transportation (Non-Medical): No  Physical Activity: Inactive (09/26/2022)   Exercise Vital Sign    Days of Exercise per Week: 0 days    Minutes of Exercise per Session: 0 min  Stress: No Stress Concern Present (09/26/2022)   [employer] of Occupational Health - Occupational Stress Questionnaire    Feeling of Stress : Not at all  Social Connections: Moderately Isolated (09/26/2022)   Social Connection and Isolation Panel [NHANES]    Frequency of Communication with Friends and Family: More than three times a week    Frequency of Social Gatherings with Friends and Family: More than three times a week    Attends Religious Services: Never    Database administrator or Organizations: No    Attends Engineer, structural: Never    Marital Status: Married    Tobacco Counseling Ready to quit: Yes Counseling given: Not Answered   Clinical Intake:  Pre-visit preparation completed: Yes  Pain : No/denies pain     Nutritional Risks: None Diabetes: No  How often do you need to have someone help you when you read instructions, pamphlets, or other written materials from your doctor or  pharmacy?: 1 - Never  Interpreter Needed?: No  Information entered by :: NAllen LPN   Activities of Daily Living    09/26/2022   11:27 AM  In your present state of health, do you have any difficulty performing the following activities:  Hearing? 0  Comment a little bit  Vision? 1  Comment has cataracts  Difficulty concentrating or making decisions? 1  Comment forgetfulness  Walking or climbing stairs? 0  Dressing or bathing? 0  Doing errands, shopping? 0  Preparing Food and eating ? N  Using the Toilet? N  In the past six months, have you accidently leaked urine?  N  Do you have problems with loss of bowel control? N  Managing your Medications? N  Managing your Finances? N  Housekeeping or managing your Housekeeping? N    Patient Care Team: Mliss Sax, MD as PCP - General (Family Medicine) Rollene Rotunda, MD as PCP - Cardiology (Cardiology)  Indicate any recent Medical Services you may have received from other than [facility] providers in the past year (date may be approximate).     Assessment:   This is a routine wellness examination for Wayne Wheeler.  Hearing/Vision screen Hearing Screening - Comments:: Denies hearing issues Vision Screening - Comments:: No regular eye exams, [facility]   Goals Addressed             This Visit's Progress    Patient Stated       09/26/2022, staying alive       Depression Screen    09/26/2022   11:41 AM 06/30/2022    2:01 PM 09/14/2021    2:38 PM 03/02/2021   11:02 AM 08/10/2020    2:07 PM 05/15/2020    2:43 PM 03/06/2020    1:40 PM  PHQ 2/9 Scores  PHQ - 2 Score 0 0 0 0 0 0 3  PHQ- 9 Score 0      11    Fall Risk    09/26/2022   11:39 AM 08/24/2022    3:03 PM 06/30/2022    2:01 PM 09/14/2021    2:35 PM 03/02/2021   11:02 AM  Fall Risk   Falls in the past year? 0 0 0 0 0  Number falls in past yr: 0 0 0 0 0  Injury with Fall? 0 0 0 0   Risk for fall due to : Medication side effect;Impaired balance/gait  No Fall Risks No Fall  Risks   Follow up Falls prevention discussed;Falls evaluation completed Falls evaluation completed Falls evaluation completed Falls prevention discussed     MEDICARE RISK AT HOME: Medicare Risk at Home Any stairs in or around the home?: Yes If so, are there any without handrails?: No Home free of loose throw rugs in walkways, pet beds, electrical cords, etc?: Yes Adequate lighting in your home to reduce risk of falls?: Yes Life alert?: No Use of a cane, walker or w/c?: No Grab bars in the bathroom?: No Shower chair or bench in shower?: Yes Elevated toilet seat or a handicapped toilet?: No  TIMED UP AND GO:  Was the test performed?  No    Cognitive Function:        09/26/2022   11:43 AM 09/14/2021    2:41 PM  6CIT Screen  What Year? 0 points 0 points  What month? 0 points 0 points  What time? 0 points 0 points  Count back from 20 0 points 0 points  Months in reverse 0 points 0 points  Repeat phrase 0 points 0 points  Total Score 0 points 0 points    Immunizations Immunization History  Administered Date(s) Administered   Influenza,inj,Quad PF,6+ Mos 09/25/2019   Influenza-Unspecified 10/19/2012   Moderna Sars-Covid-2 Vaccination 05/25/2019, 07/12/2019   PNEUMOCOCCAL CONJUGATE-20 03/02/2021   Tdap 02/01/2010, 03/02/2021   Zoster Recombinant(Shingrix) 03/15/2021, 03/29/2022    TDAP status: Up to date  Flu Vaccine status: Due, Education has been provided regarding the importance of this vaccine. Advised may receive this vaccine at local pharmacy or Health Dept. Aware to provide a copy of the vaccination record if obtained from local pharmacy or Health Dept. Verbalized acceptance  and understanding.  Pneumococcal vaccine status: Up to date  Covid-19 vaccine status: Information provided on how to obtain vaccines.   Qualifies for Shingles Vaccine? Yes   Zostavax completed No   Shingrix Completed?: Yes  Screening Tests Health Maintenance  Topic Date Due   Colonoscopy   07/09/2020   INFLUENZA VACCINE  08/11/2022   Medicare Annual Wellness (AWV)  09/26/2023   DTaP/Tdap/Td (3 - Td or Tdap) 03/03/2031   Pneumonia Vaccine 71+ Years old  Completed   Hepatitis C Screening  Completed   Zoster Vaccines- Shingrix  Completed   HPV VACCINES  Aged Out   COVID-19 Vaccine  Discontinued    Health Maintenance  Health Maintenance Due  Topic Date Due   Colonoscopy  07/09/2020   INFLUENZA VACCINE  08/11/2022    Colorectal cancer screening: Will see Dr. Elnoria Howard in October  Lung Cancer Screening: (Low Dose CT Chest recommended if Age 33-80 years, 20 pack-year currently smoking OR have quit w/in 15years.) does not qualify.   Lung Cancer Screening Referral: no  Additional Screening:  Hepatitis C Screening: does qualify; Completed 03/26/2021  Vision Screening: Recommended annual ophthalmology exams for early detection of glaucoma and other disorders of the eye. Is the patient up to date with their annual eye exam?  No  Who is the provider or what is the name of the office in which the patient attends annual eye exams? [facility] If pt is not established with a provider, would they like to be referred to a provider to establish care? No .   Dental Screening: Recommended annual dental exams for proper oral hygiene  Diabetic Foot Exam: n/a  Community Resource Referral / Chronic Care Management: CRR required this visit?  No   CCM required this visit?  No     Plan:     I have personally reviewed and noted the following in the patient's chart:   Medical and social history Use of alcohol, tobacco or illicit drugs  Current medications and supplements including opioid prescriptions. Patient is not currently taking opioid prescriptions. Functional ability and status Nutritional status Physical activity Advanced directives List of other physicians Hospitalizations, surgeries, and ER visits in previous 12 months Vitals Screenings to include cognitive, depression,  and falls Referrals and appointments  In addition, I have reviewed and discussed with patient certain preventive protocols, quality metrics, and best practice recommendations. A written personalized care plan for preventive services as well as general preventive health recommendations were provided to patient.     Barb Merino, LPN   1/61/0960   After Visit Summary: (Pick Up) Due to this being a telephonic visit, with patients personalized plan was offered to patient and patient has requested to Pick up at office.  Nurse Notes: none

## 2022-10-03 ENCOUNTER — Other Ambulatory Visit: Payer: Self-pay | Admitting: *Deleted

## 2022-10-03 DIAGNOSIS — J42 Unspecified chronic bronchitis: Secondary | ICD-10-CM

## 2022-10-11 ENCOUNTER — Ambulatory Visit (INDEPENDENT_AMBULATORY_CARE_PROVIDER_SITE_OTHER): Payer: Medicare HMO | Admitting: Family Medicine

## 2022-10-11 ENCOUNTER — Encounter: Payer: Self-pay | Admitting: Family Medicine

## 2022-10-11 VITALS — BP 135/74 | HR 63 | Temp 98.6°F | Ht 68.0 in | Wt 215.4 lb

## 2022-10-11 DIAGNOSIS — F418 Other specified anxiety disorders: Secondary | ICD-10-CM

## 2022-10-11 DIAGNOSIS — Z720 Tobacco use: Secondary | ICD-10-CM

## 2022-10-11 DIAGNOSIS — G5601 Carpal tunnel syndrome, right upper limb: Secondary | ICD-10-CM | POA: Diagnosis not present

## 2022-10-11 DIAGNOSIS — M25511 Pain in right shoulder: Secondary | ICD-10-CM

## 2022-10-11 DIAGNOSIS — K219 Gastro-esophageal reflux disease without esophagitis: Secondary | ICD-10-CM

## 2022-10-11 MED ORDER — PANTOPRAZOLE SODIUM 40 MG PO TBEC
40.0000 mg | DELAYED_RELEASE_TABLET | Freq: Every day | ORAL | 1 refills | Status: DC
Start: 2022-10-11 — End: 2022-10-11

## 2022-10-11 MED ORDER — PANTOPRAZOLE SODIUM 40 MG PO TBEC
40.0000 mg | DELAYED_RELEASE_TABLET | Freq: Every day | ORAL | 1 refills | Status: AC
Start: 2022-10-11 — End: ?

## 2022-10-11 NOTE — Progress Notes (Signed)
Established Patient Office Visit   Subjective:  Patient ID: Wayne Wheeler, male    DOB: [birth year]  Age: 67 y.o. MRN: [id number]  No chief complaint on file.   HPI Encounter Diagnoses  Name Primary?   Depression with anxiety Yes   Tobacco use    Right shoulder pain, unspecified chronicity    Gastroesophageal reflux disease, unspecified whether esophagitis present    Carpal tunnel syndrome of right wrist    For follow-up of above.  Continues with the Lexapro 20 mg at at bedtime.  Neurology diagnosed patient with carpal tunnel syndrome of the right wrist status post nerve conduction studies.  No evidence for cervical neuropathy.  Pain persists in the right shoulder area.  Protonix has been helping reflux a great deal.  Status post coronary artery stenting of a 90% lesion in his left main.  He continues to smoke.  Chantix is helping some.  He cannot chew gum with lower plates.   Review of Systems  Constitutional: Negative.   HENT: Negative.    Eyes:  Negative for blurred vision, discharge and redness.  Respiratory: Negative.    Cardiovascular: Negative.   Gastrointestinal:  Negative for abdominal pain.  Genitourinary: Negative.   Musculoskeletal:  Positive for joint pain. Negative for myalgias.  Skin:  Negative for rash.  Neurological:  Negative for tingling, loss of consciousness and weakness.  Endo/Heme/Allergies:  Negative for polydipsia.     Current Outpatient Medications:    aspirin EC 81 MG tablet, Take 81 mg by mouth daily. Swallow whole., Disp: , Rfl:    clopidogrel (PLAVIX) 75 MG tablet, Take 1 tablet (75 mg total) by mouth daily with breakfast., Disp: 90 tablet, Rfl: 2   cyanocobalamin 1000 MCG tablet, Take 1,000 mcg by mouth daily., Disp: , Rfl:    escitalopram (LEXAPRO) 20 MG tablet, Take 1 tablet (20 mg total) by mouth at bedtime., Disp: 90 tablet, Rfl: 0   ezetimibe (ZETIA) 10 MG tablet, Take 1 tablet (10 mg total) by mouth daily., Disp: 90 tablet, Rfl: 3    gabapentin (NEURONTIN) 300 MG capsule, Take 1 capsule (300 mg total) by mouth 2 (two) times daily., Disp: 60 capsule, Rfl: 5   hydrochlorothiazide (HYDRODIURIL) 25 MG tablet, Take 1 tablet by mouth once daily, Disp: 90 tablet, Rfl: 0   lisinopril (ZESTRIL) 40 MG tablet, Take 1 tablet by mouth once daily, Disp: 90 tablet, Rfl: 0   nitroGLYCERIN (NITROSTAT) 0.4 MG SL tablet, Place 1 tablet (0.4 mg total) under the tongue every 5 (five) minutes as needed., Disp: 25 tablet, Rfl: 2   simvastatin (ZOCOR) 40 MG tablet, TAKE 1 TABLET BY MOUTH AT BEDTIME, Disp: 90 tablet, Rfl: 0   umeclidinium-vilanterol (ANORO ELLIPTA) 62.5-25 MCG/ACT AEPB, Inhale 1 puff into the lungs daily., Disp: 90 each, Rfl: 3   varenicline (CHANTIX) 1 MG tablet, Take 1 tablet (1 mg total) by mouth 2 (two) times daily., Disp: 60 tablet, Rfl: 2   pantoprazole (PROTONIX) 40 MG tablet, Take 1 tablet (40 mg total) by mouth daily., Disp: 90 tablet, Rfl: 1   Objective:     BP 135/74 (BP Location: Left Arm, Patient Position: Sitting)   Pulse 63   Temp 98.6 F (37 C) (Temporal)   Ht 5\' 8"  (1.727 m)   Wt 215 lb 6.4 oz (97.7 kg)   SpO2 95%   BMI 32.75 kg/m    Physical Exam Constitutional:      General: He is not in acute distress.  Appearance: Normal appearance. He is not ill-appearing, toxic-appearing or diaphoretic.  HENT:     Head: Normocephalic and atraumatic.     Right Ear: External ear normal.     Left Ear: External ear normal.  Eyes:     General: No scleral icterus.       Right eye: No discharge.        Left eye: No discharge.     Extraocular Movements: Extraocular movements intact.     Conjunctiva/sclera: Conjunctivae normal.  Cardiovascular:     Rate and Rhythm: Normal rate and regular rhythm.     Pulses:          Radial pulses are 2+ on the right side.  Pulmonary:     Effort: Pulmonary effort is normal. No respiratory distress.     Breath sounds: Decreased air movement present.  Skin:    General: Skin is  warm and dry.  Neurological:     Mental Status: He is alert and oriented to person, place, and time.  Psychiatric:        Mood and Affect: Mood normal.        Behavior: Behavior normal.      No results found for any visits on 10/11/22.    The 10-year ASCVD risk score (Arnett DK, et al., 2019) is: 23.7%    Assessment & Plan:   Depression with anxiety  Tobacco use  Right shoulder pain, unspecified chronicity -     Ambulatory referral to Sports Medicine  Gastroesophageal reflux disease, unspecified whether esophagitis present -     Pantoprazole Sodium; Take 1 tablet (40 mg total) by mouth daily.  Dispense: 90 tablet; Refill: 1  Carpal tunnel syndrome of right wrist -     Ambulatory referral to Sports Medicine    No follow-ups on file.  Continue Lexapro.  Will use nicotine lozenges with the Chantix for continued weaning of tobacco.  Sports medicine referral for opinion on right shoulder pain and carpal tunnel of right wrist.  Mliss Sax, MD

## 2022-10-14 ENCOUNTER — Other Ambulatory Visit: Payer: Self-pay | Admitting: Family Medicine

## 2022-10-14 DIAGNOSIS — Z1212 Encounter for screening for malignant neoplasm of rectum: Secondary | ICD-10-CM

## 2022-10-14 DIAGNOSIS — Z1211 Encounter for screening for malignant neoplasm of colon: Secondary | ICD-10-CM

## 2022-10-19 DIAGNOSIS — Z1211 Encounter for screening for malignant neoplasm of colon: Secondary | ICD-10-CM | POA: Diagnosis not present

## 2022-10-19 DIAGNOSIS — R079 Chest pain, unspecified: Secondary | ICD-10-CM | POA: Diagnosis not present

## 2022-11-05 DIAGNOSIS — Z1212 Encounter for screening for malignant neoplasm of rectum: Secondary | ICD-10-CM | POA: Diagnosis not present

## 2022-11-05 DIAGNOSIS — Z1211 Encounter for screening for malignant neoplasm of colon: Secondary | ICD-10-CM | POA: Diagnosis not present

## 2022-11-05 LAB — HM COLONOSCOPY

## 2022-11-05 LAB — COLOGUARD: Cologuard: NEGATIVE

## 2022-11-11 LAB — COLOGUARD: COLOGUARD: NEGATIVE

## 2022-11-26 DIAGNOSIS — R69 Illness, unspecified: Secondary | ICD-10-CM | POA: Diagnosis not present

## 2022-12-05 NOTE — Progress Notes (Unsigned)
NEUROLOGY FOLLOW UP OFFICE NOTE  Wayne Wheeler [id number]  Subjective:  Wayne Wheeler is a 67 y.o. year old right-handed male with a medical history of HTN, HLD, pre-diabetes, depression, current smoker who we last saw on 08/24/22 for right arm pain and tremor.  To briefly review: Patient thinks his tremor in the right arm and neck after right shoulder surgery about 13 years ago. His tremor is worse when trying to use his arm. His head will shake when he is nervous. He denies a family history.   Patient has pain in his right axilla with tingling into his right hand. This has been present for about a year. It is worse when his arm is at his at his side. As previously mentioned, he does have a history of right rotator cuff pain and surgery. He still has pain in his shoulder. He has neck pain as well with limited range of motion of the neck. He denies symptoms in his left arm. He denies numbness and tingling in his legs, but does get some swelling. He takes diuretics for this. He has low back pain and leg weakness and stiffness.   Patient has seen neurology in the past for tremors. He was not given any medication for this.    Patient is on lexapro for depression. He takes B12 1000 mcg daily for energy, which he states helps with energy.    He does not report any constitutional symptoms like fever, night sweats, anorexia or unintentional weight loss.   EtOH use: Occasional drink. He does not know if EtOH changes his tremor Restrictive diet? No Family history of neuropathy/myopathy/neurologic disease? none  Most recent Assessment and Plan (08/24/22): Wayne Wheeler is a 67 y.o. male who presents for evaluation of pain in RUE and tremor in hands and neck. He has a relevant medical history of HTN, HLD, pre-diabetes, depression, current smoker. His neurological examination is pertinent for intention tremor in bilateral hands and tremor in neck and diminished sensation in right arm below the elbow. The  etiology of his RUE pain is currently unclear. It sounds neuropathic and could be 2/2 radiculopathy or a mononeuropathy such as ulnar at elbow. It could also be orthopedic in nature. I will get an EMG to clarify further.   In terms of his tremor, it is consistent with essential tremor. After discussion, patient preferred a treatment that could potentially treat tremor and nerve pain, so I will start with gabapentin, but he may need primidone for his tremor.   PLAN: -EMG: RUE -Gabapentin 300 mg at bedtime for 1 week, then BID (may work for tremor and pain, so patient preferred this to primidone)  Since their last visit: EMG of RUE was consistent with carpal tunnel syndrome. At EMG on 09/19/22, patient mentioned his symptoms (pain and tremor) had improved since starting gabapentin.  ***  MEDICATIONS:  Outpatient Encounter Medications as of 12/15/2022  Medication Sig   aspirin EC 81 MG tablet Take 81 mg by mouth daily. Swallow whole.   clopidogrel (PLAVIX) 75 MG tablet Take 1 tablet (75 mg total) by mouth daily with breakfast.   cyanocobalamin 1000 MCG tablet Take 1,000 mcg by mouth daily.   escitalopram (LEXAPRO) 20 MG tablet Take 1 tablet (20 mg total) by mouth at bedtime.   ezetimibe (ZETIA) 10 MG tablet Take 1 tablet (10 mg total) by mouth daily.   gabapentin (NEURONTIN) 300 MG capsule Take 1 capsule (300 mg total) by mouth 2 (two) times daily.  hydrochlorothiazide (HYDRODIURIL) 25 MG tablet Take 1 tablet by mouth once daily   lisinopril (ZESTRIL) 40 MG tablet Take 1 tablet by mouth once daily   nitroGLYCERIN (NITROSTAT) 0.4 MG SL tablet Place 1 tablet (0.4 mg total) under the tongue every 5 (five) minutes as needed.   pantoprazole (PROTONIX) 40 MG tablet Take 1 tablet (40 mg total) by mouth daily.   simvastatin (ZOCOR) 40 MG tablet TAKE 1 TABLET BY MOUTH AT BEDTIME   umeclidinium-vilanterol (ANORO ELLIPTA) 62.5-25 MCG/ACT AEPB Inhale 1 puff into the lungs daily.   varenicline (CHANTIX) 1  MG tablet Take 1 tablet (1 mg total) by mouth 2 (two) times daily.   No facility-administered encounter medications on file as of 12/15/2022.    PAST MEDICAL HISTORY: Past Medical History:  Diagnosis Date   Depression    Dyslipidemia    Hypertension    Resting tremor     PAST SURGICAL HISTORY: Past Surgical History:  Procedure Laterality Date   CORONARY STENT INTERVENTION N/A 09/07/2022   Procedure: CORONARY STENT INTERVENTION;  Surgeon: Corky Crafts, MD;  Location: [facility] INVASIVE CV LAB;  Service: Cardiovascular;  Laterality: N/A;   LEFT HEART CATH AND CORONARY ANGIOGRAPHY N/A 09/07/2022   Procedure: LEFT HEART CATH AND CORONARY ANGIOGRAPHY;  Surgeon: Corky Crafts, MD;  Location: [facility] INVASIVE CV LAB;  Service: Cardiovascular;  Laterality: N/A;   SHOULDER SURGERY Right 06/10/2009    ALLERGIES: Allergies  Allergen Reactions   Prednisone Other (See Comments)    Dyspepsia    FAMILY HISTORY: Family History  Problem Relation Age of Onset   Depression Mother    Heart disease Father     SOCIAL HISTORY: Social History   Tobacco Use   Smoking status: Some Days    Current packs/day: 0.50    Types: Cigarettes   Smokeless tobacco: Never  Vaping Use   Vaping status: Never Used  Substance Use Topics   Alcohol use: Yes    Alcohol/week: 1.0 standard drink of alcohol    Types: 1 Cans of beer per week    Comment: social   Drug use: Never   Social History   Social History Narrative   Are you right handed or left handed? 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 Yes.  Has a wife who stays with him at times.     Who lives with you? Cat and  2 dog   What type of home do you live in: 1 story or 2 story? two    Caffeine  2 cups daily      Objective:  Vital Signs:  There were no vitals taken for this visit.  ***  Labs and Imaging review: New results: BMP (09/19/22): significant for glucose of 144 CBC w/ diff  (09/14/22): unremarkable  EMG (RUE 09/19/22): NCV & EMG Findings: Extensive electrodiagnostic evaluation of the right upper limb shows: Right median-ulnar palmar sensory responses show abnormal peak latency difference ((Median Palm-Wrist)-(Ulnar Palm-Wrist), 0.45 ms). Right median, ulnar, and radial sensory responses are within normal limits. Right median (APB) and ulnar (ADM) motor responses are within normal limits. There is no evidence of active or chronic motor axon loss changes affecting any of the tested muscles on needle examination. Motor unit configuration and recruitment pattern is within normal limits.   Impression: This is an abnormal study. The findings are most consistent with the following: Evidence of a right median mononeuropathy at or distal to  the wrist, consistent with carpal tunnel syndrome, very mild in degree electrically. No definitive electrodiagnostic evidence of a right cervical (C5-T1) motor radiculopathy. Screening studies for right ulnar or radial mononeuropathies are normal.  Previously reviewed results: 06/30/22: TSH wnl CMP unremarkable CBC unremarkable Lipid panel: Component     Latest Ref Rng 06/30/2022  Cholesterol     0 - 200 mg/dL 540   Triglycerides     0.0 - 149.0 mg/dL 981.1   HDL Cholesterol     >39.00 mg/dL 91.47 (L)   VLDL     0.0 - 40.0 mg/dL 82.9   LDL (calc)     0 - 99 mg/dL 76   Total CHOL/HDL Ratio 4   NonHDL 101.89     HIV (03/26/21) non-reactive HbA1c (05/15/20): 5.9   Imaging: Cervical xray (03/02/21): FINDINGS: There is no evidence of cervical spine fracture or prevertebral soft tissue swelling. Alignment is normal.   There is preservation of the normal vertebral body and disc heights.   There is osteopenia. Mild cervical spondylosis. There is multilevel uncinate joint and facet hypertrophy.   Degenerative foraminal stenosis is noted which is moderate to severe on the left at C2-3, mild-to-moderate on the right and moderate  to severe on the left at C3-4, C4-5 and C5-6, moderate to severe on the left at C6-7.   There is no precervical soft tissue thickening. Visualized lung apices are clear. There are calcifications in the cervical carotid arteries.   IMPRESSION: Osteopenia, degenerative changes with multilevel left-greater-than-right foraminal stenosis, cervical spondylosis. No evidence of fractures. Carotid atherosclerosis.  Assessment/Plan:  This is Wayne Wheeler, a 67 y.o. male with: Right carpal tunnel syndrome*** Essential tremor***   Plan: ***Continue gabapentin 300 mg BID  Return to clinic in ***  Total time spent reviewing records, interview, history/exam, documentation, and coordination of care on day of encounter:  *** min  Jacquelyne Balint, MD

## 2022-12-06 ENCOUNTER — Other Ambulatory Visit: Payer: Self-pay | Admitting: Family Medicine

## 2022-12-06 DIAGNOSIS — F418 Other specified anxiety disorders: Secondary | ICD-10-CM

## 2022-12-13 ENCOUNTER — Ambulatory Visit (HOSPITAL_COMMUNITY)
Admission: RE | Admit: 2022-12-13 | Discharge: 2022-12-13 | Disposition: A | Discharge status: Home / Self Care | Payer: Medicare HMO | Source: Ambulatory Visit | Attending: Pulmonary Disease | Admitting: Pulmonary Disease

## 2022-12-13 ENCOUNTER — Other Ambulatory Visit: Payer: Self-pay | Admitting: Family Medicine

## 2022-12-13 DIAGNOSIS — J984 Other disorders of lung: Secondary | ICD-10-CM | POA: Insufficient documentation

## 2022-12-13 DIAGNOSIS — F17210 Nicotine dependence, cigarettes, uncomplicated: Secondary | ICD-10-CM | POA: Insufficient documentation

## 2022-12-13 DIAGNOSIS — J42 Unspecified chronic bronchitis: Secondary | ICD-10-CM

## 2022-12-13 DIAGNOSIS — R062 Wheezing: Secondary | ICD-10-CM | POA: Insufficient documentation

## 2022-12-13 DIAGNOSIS — I10 Essential (primary) hypertension: Secondary | ICD-10-CM

## 2022-12-13 LAB — PULMONARY FUNCTION TEST
DL/VA % pred: 67 %
DL/VA: 2.79 ml/min/mmHg/L
DLCO unc % pred: 73 %
DLCO unc: 18.2 ml/min/mmHg
FEF 25-75 Post: 2.48 L/s
FEF 25-75 Pre: 2.17 L/s
FEF2575-%Change-Post: 14 %
FEF2575-%Pred-Post: 101 %
FEF2575-%Pred-Pre: 89 %
FEV1-%Change-Post: 2 %
FEV1-%Pred-Post: 99 %
FEV1-%Pred-Pre: 96 %
FEV1-Post: 3.09 L
FEV1-Pre: 3.01 L
FEV1FVC-%Change-Post: 1 %
FEV1FVC-%Pred-Pre: 99 %
FEV6-%Change-Post: 0 %
FEV6-%Pred-Post: 102 %
FEV6-%Pred-Pre: 101 %
FEV6-Post: 4.05 L
FEV6-Pre: 4.05 L
FEV6FVC-%Change-Post: -1 %
FEV6FVC-%Pred-Post: 104 %
FEV6FVC-%Pred-Pre: 105 %
FVC-%Change-Post: 1 %
FVC-%Pred-Post: 98 %
FVC-%Pred-Pre: 96 %
FVC-Post: 4.12 L
FVC-Pre: 4.06 L
Post FEV1/FVC ratio: 75 %
Post FEV6/FVC ratio: 98 %
Pre FEV1/FVC ratio: 74 %
Pre FEV6/FVC Ratio: 100 %
RV % pred: 124 %
RV: 2.84 L
TLC % pred: 106 %
TLC: 7.06 L

## 2022-12-13 MED ORDER — ALBUTEROL SULFATE (2.5 MG/3ML) 0.083% IN NEBU
2.5000 mg | INHALATION_SOLUTION | Freq: Once | RESPIRATORY_TRACT | Status: AC
  Administered 2022-12-13: 2.5 mg via RESPIRATORY_TRACT

## 2022-12-15 ENCOUNTER — Ambulatory Visit: Payer: Medicare HMO | Admitting: Neurology

## 2022-12-22 ENCOUNTER — Other Ambulatory Visit: Payer: Self-pay | Admitting: Family Medicine

## 2022-12-22 DIAGNOSIS — E7800 Pure hypercholesterolemia, unspecified: Secondary | ICD-10-CM

## 2023-01-01 ENCOUNTER — Other Ambulatory Visit: Payer: Self-pay | Admitting: Pulmonary Disease

## 2023-01-25 ENCOUNTER — Other Ambulatory Visit: Payer: Self-pay | Admitting: Pulmonary Disease

## 2023-03-06 ENCOUNTER — Other Ambulatory Visit: Payer: Self-pay | Admitting: Family Medicine

## 2023-03-06 DIAGNOSIS — F418 Other specified anxiety disorders: Secondary | ICD-10-CM

## 2023-03-10 ENCOUNTER — Other Ambulatory Visit: Payer: Self-pay | Admitting: Family Medicine

## 2023-03-10 DIAGNOSIS — I10 Essential (primary) hypertension: Secondary | ICD-10-CM

## 2023-03-14 ENCOUNTER — Other Ambulatory Visit: Payer: Self-pay | Admitting: Pulmonary Disease

## 2023-03-23 NOTE — Progress Notes (Deleted)
 NEUROLOGY FOLLOW UP OFFICE NOTE  Wayne Wheeler [id number]  Subjective:  Wayne Wheeler is a 68 y.o. year old right-handed male with a medical history of HTN, HLD, pre-diabetes, depression, current smoker who we last saw on 08/24/22 for right arm pain and tremor.  To briefly review: Patient thinks his tremor in the right arm and neck after right shoulder surgery about 13 years ago. His tremor is worse when trying to use his arm. His head will shake when he is nervous. He denies a family history.   Patient has pain in his right axilla with tingling into his right hand. This has been present for about a year. It is worse when his arm is at his at his side. As previously mentioned, he does have a history of right rotator cuff pain and surgery. He still has pain in his shoulder. He has neck pain as well with limited range of motion of the neck. He denies symptoms in his left arm. He denies numbness and tingling in his legs, but does get some swelling. He takes diuretics for this. He has low back pain and leg weakness and stiffness.   Patient has seen neurology in the past for tremors. He was not given any medication for this.    Patient is on lexapro for depression. He takes B12 1000 mcg daily for energy, which he states helps with energy.    He does not report any constitutional symptoms like fever, night sweats, anorexia or unintentional weight loss.   EtOH use: Occasional drink. He does not know if EtOH changes his tremor Restrictive diet? No Family history of neuropathy/myopathy/neurologic disease? none  Most recent Assessment and Plan (08/24/22): Wayne Wheeler is a 68 y.o. male who presents for evaluation of pain in RUE and tremor in hands and neck. He has a relevant medical history of HTN, HLD, pre-diabetes, depression, current smoker. His neurological examination is pertinent for intention tremor in bilateral hands and tremor in neck and diminished sensation in right arm below the elbow. The  etiology of his RUE pain is currently unclear. It sounds neuropathic and could be 2/2 radiculopathy or a mononeuropathy such as ulnar at elbow. It could also be orthopedic in nature. I will get an EMG to clarify further.   In terms of his tremor, it is consistent with essential tremor. After discussion, patient preferred a treatment that could potentially treat tremor and nerve pain, so I will start with gabapentin, but he may need primidone for his tremor.   PLAN: -EMG: RUE -Gabapentin 300 mg at bedtime for 1 week, then BID (may work for tremor and pain, so patient preferred this to primidone)  Since their last visit: EMG of RUE was consistent with carpal tunnel syndrome. At EMG on 09/19/22, patient mentioned his symptoms (pain and tremor) had improved since starting gabapentin.  ***  MEDICATIONS:  Outpatient Encounter Medications as of 03/31/2023  Medication Sig   aspirin EC 81 MG tablet Take 81 mg by mouth daily. Swallow whole.   clopidogrel (PLAVIX) 75 MG tablet Take 1 tablet (75 mg total) by mouth daily with breakfast.   cyanocobalamin 1000 MCG tablet Take 1,000 mcg by mouth daily.   escitalopram (LEXAPRO) 20 MG tablet TAKE 1 TABLET BY MOUTH AT BEDTIME   ezetimibe (ZETIA) 10 MG tablet Take 1 tablet (10 mg total) by mouth daily.   gabapentin (NEURONTIN) 300 MG capsule Take 1 capsule (300 mg total) by mouth 2 (two) times daily.   hydrochlorothiazide (HYDRODIURIL)  25 MG tablet Take 1 tablet by mouth once daily   lisinopril (ZESTRIL) 40 MG tablet Take 1 tablet by mouth once daily   nitroGLYCERIN (NITROSTAT) 0.4 MG SL tablet Place 1 tablet (0.4 mg total) under the tongue every 5 (five) minutes as needed.   pantoprazole (PROTONIX) 40 MG tablet Take 1 tablet (40 mg total) by mouth daily.   simvastatin (ZOCOR) 40 MG tablet TAKE 1 TABLET BY MOUTH AT BEDTIME   umeclidinium-vilanterol (ANORO ELLIPTA) 62.5-25 MCG/ACT AEPB Inhale 1 puff into the lungs daily.   varenicline (CHANTIX) 1 MG tablet Take  1 tablet (1 mg total) by mouth 2 (two) times daily.   No facility-administered encounter medications on file as of 03/31/2023.    PAST MEDICAL HISTORY: Past Medical History:  Diagnosis Date   Depression    Dyslipidemia    Hypertension    Resting tremor     PAST SURGICAL HISTORY: Past Surgical History:  Procedure Laterality Date   CORONARY STENT INTERVENTION N/A 09/07/2022   Procedure: CORONARY STENT INTERVENTION;  Surgeon: Corky Crafts, MD;  Location: [facility] INVASIVE CV LAB;  Service: Cardiovascular;  Laterality: N/A;   LEFT HEART CATH AND CORONARY ANGIOGRAPHY N/A 09/07/2022   Procedure: LEFT HEART CATH AND CORONARY ANGIOGRAPHY;  Surgeon: Corky Crafts, MD;  Location: [facility] INVASIVE CV LAB;  Service: Cardiovascular;  Laterality: N/A;   SHOULDER SURGERY Right 06/10/2009    ALLERGIES: Allergies  Allergen Reactions   Prednisone Other (See Comments)    Dyspepsia    FAMILY HISTORY: Family History  Problem Relation Age of Onset   Depression Mother    Heart disease Father     SOCIAL HISTORY: Social History   Tobacco Use   Smoking status: Some Days    Current packs/day: 0.50    Types: Cigarettes   Smokeless tobacco: Never  Vaping Use   Vaping status: Never Used  Substance Use Topics   Alcohol use: Yes    Alcohol/week: 1.0 standard drink of alcohol    Types: 1 Cans of beer per week    Comment: social   Drug use: Never   Social History   Social History Narrative   Are you right handed or left handed? 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 Yes.  Has a wife who stays with him at times.     Who lives with you? Cat and  2 dog   What type of home do you live in: 1 story or 2 story? two    Caffeine  2 cups daily      Objective:  Vital Signs:  There were no vitals taken for this visit.  ***  Labs and Imaging review: New results: BMP (09/19/22): significant for glucose of 144 CBC w/ diff (09/14/22):  unremarkable   EMG (RUE 09/19/22): NCV & EMG Findings: Extensive electrodiagnostic evaluation of the right upper limb shows: Right median-ulnar palmar sensory responses show abnormal peak latency difference ((Median Palm-Wrist)-(Ulnar Palm-Wrist), 0.45 ms). Right median, ulnar, and radial sensory responses are within normal limits. Right median (APB) and ulnar (ADM) motor responses are within normal limits. There is no evidence of active or chronic motor axon loss changes affecting any of the tested muscles on needle examination. Motor unit configuration and recruitment pattern is within normal limits.   Impression: This is an abnormal study. The findings are most consistent with the following: Evidence of a right median mononeuropathy at or distal to the  wrist, consistent with carpal tunnel syndrome, very mild in degree electrically. No definitive electrodiagnostic evidence of a right cervical (C5-T1) motor radiculopathy. Screening studies for right ulnar or radial mononeuropathies are normal.   Previously reviewed results: 06/30/22: TSH wnl CMP unremarkable CBC unremarkable Lipid panel: Component     Latest Ref Rng 06/30/2022  Cholesterol     0 - 200 mg/dL 161   Triglycerides     0.0 - 149.0 mg/dL 096.0   HDL Cholesterol     >39.00 mg/dL 45.40 (L)   VLDL     0.0 - 40.0 mg/dL 98.1   LDL (calc)     0 - 99 mg/dL 76   Total CHOL/HDL Ratio 4   NonHDL 101.89     HIV (03/26/21) non-reactive HbA1c (05/15/20): 5.9   Imaging: Cervical xray (03/02/21): FINDINGS: There is no evidence of cervical spine fracture or prevertebral soft tissue swelling. Alignment is normal.   There is preservation of the normal vertebral body and disc heights.   There is osteopenia. Mild cervical spondylosis. There is multilevel uncinate joint and facet hypertrophy.   Degenerative foraminal stenosis is noted which is moderate to severe on the left at C2-3, mild-to-moderate on the right and moderate  to severe on the left at C3-4, C4-5 and C5-6, moderate to severe on the left at C6-7.   There is no precervical soft tissue thickening. Visualized lung apices are clear. There are calcifications in the cervical carotid arteries.   IMPRESSION: Osteopenia, degenerative changes with multilevel left-greater-than-right foraminal stenosis, cervical spondylosis. No evidence of fractures. Carotid atherosclerosis.  Assessment/Plan:  This is Wayne Wheeler, a 68 y.o. male with: Right carpal tunnel syndrome*** Essential tremor***    Plan: *** -Continue gabapentin 300 mg BID  Return to clinic in ***  Total time spent reviewing records, interview, history/exam, documentation, and coordination of care on day of encounter:  *** min  Jacquelyne Balint, MD

## 2023-03-24 ENCOUNTER — Telehealth: Payer: Self-pay | Admitting: Acute Care

## 2023-03-24 ENCOUNTER — Other Ambulatory Visit: Payer: Self-pay | Admitting: Emergency Medicine

## 2023-03-24 DIAGNOSIS — F17210 Nicotine dependence, cigarettes, uncomplicated: Secondary | ICD-10-CM

## 2023-03-24 DIAGNOSIS — Z122 Encounter for screening for malignant neoplasm of respiratory organs: Secondary | ICD-10-CM

## 2023-03-24 DIAGNOSIS — Z87891 Personal history of nicotine dependence: Secondary | ICD-10-CM

## 2023-03-24 NOTE — Telephone Encounter (Signed)
 Lung Cancer Screening Narrative/Criteria Questionnaire (Cigarette Smokers Only- No Cigars/Pipes/vapes)   Wayne Wheeler   SDMV:04/20/2023 at 2:30 - [name]        1955-07-12   LDCT: 04/21/2023 at 3:00 - [facility]    68 y.o.   Phone: [phone]  Lung Screening Narrative (confirm age 76-77 yrs Medicare / 50-80 yrs Private pay insurance)   Insurance information:BCBS   Referring Provider:Dr. Isaiah Serge   This screening involves an initial phone call with a team member from our program. It is called a shared decision making visit. The initial meeting is required by  insurance and Medicare to make sure you understand the program. This appointment takes about 15-20 minutes to complete. You will complete the screening scan at your scheduled date/time.  This scan takes about 5-10 minutes to complete. You can eat and drink normally before and after the scan.  Criteria questions for Lung Cancer Screening:   Are you a current or former smoker? Current Age began smoking: 68yo   If you are a former smoker, what year did you quit smoking? N/A(within 15 yrs)   To calculate your smoking history, I need an accurate estimate of how many packs of cigarettes you smoked per day and for how many years. (Not just the number of PPD you are now smoking)   Years smoking 44 x Packs per day 1 = Pack years 44   (at least 20 pack yrs)   (Make sure they understand that we need to know how much they have smoked in the past, not just the number of PPD they are smoking now)  Do you have a personal history of cancer?  No    Do you have a family history of cancer? Yes  (cancer type and and relative) Sister - cervical   Are you coughing up blood?  No  Have you had unexplained weight loss of 15 lbs or more in the last 6 months? No  It looks like you meet all criteria.  When would be a good time for [place] to schedule you for this screening?   Additional information: N/A

## 2023-03-30 ENCOUNTER — Other Ambulatory Visit: Payer: Self-pay | Admitting: Neurology

## 2023-03-30 DIAGNOSIS — G250 Essential tremor: Secondary | ICD-10-CM

## 2023-03-30 DIAGNOSIS — M79601 Pain in right arm: Secondary | ICD-10-CM

## 2023-03-31 ENCOUNTER — Ambulatory Visit: Payer: Medicare HMO | Admitting: Neurology

## 2023-04-20 ENCOUNTER — Encounter: Payer: Self-pay | Admitting: Adult Health

## 2023-04-20 ENCOUNTER — Ambulatory Visit (INDEPENDENT_AMBULATORY_CARE_PROVIDER_SITE_OTHER): Admitting: Adult Health

## 2023-04-20 DIAGNOSIS — F17210 Nicotine dependence, cigarettes, uncomplicated: Secondary | ICD-10-CM | POA: Diagnosis not present

## 2023-04-20 NOTE — Progress Notes (Signed)
  Virtual Visit via Telephone Note  I connected with Wayne Wheeler , 04/20/23 2:19 PM by a telemedicine application and verified that I am speaking with the correct person using two identifiers.  Location: Patient: home Provider: home   I discussed the limitations of evaluation and management by telemedicine and the availability of in person appointments. The patient expressed understanding and agreed to proceed.   Shared Decision Making Visit Lung Cancer Screening Program [phone])   Eligibility: 68 y.o. Pack Years Smoking History Calculation = 44 pack years  (# packs/per year x # years smoked) Recent History of coughing up blood  no Unexplained weight loss? no ( >Than 15 pounds within the last 6 months ) Prior History Lung / other cancer no (Diagnosis within the last 5 years already requiring surveillance chest CT Scans). Smoking Status Current Smoker  Visit Components: Discussion included one or more decision making aids. YES Discussion included risk/benefits of screening. YES Discussion included potential follow up diagnostic testing for abnormal scans. YES Discussion included meaning and risk of over diagnosis. YES Discussion included meaning and risk of False Positives. YES Discussion included meaning of total radiation exposure. YES  Counseling Included: Importance of adherence to annual lung cancer LDCT screening. YES Impact of comorbidities on ability to participate in the program. YES Ability and willingness to under diagnostic treatment. YES  Smoking Cessation Counseling: Current Smokers:  Discussed importance of smoking cessation. yes Information about tobacco cessation classes and interventions provided to patient. yes Patient provided with "ticket" for LDCT Scan. yes Symptomatic Patient. NO Diagnosis Code: Tobacco Use Z72.0 Asymptomatic Patient yes  Counseling (Intermediate counseling: > three minutes counseling) Y8657 (CT Chest Lung Cancer Screening Low  Dose W/O CM) [id number]  Z12.2-Screening of respiratory organs Z87.891-Personal history of nicotine dependence   Danford Bad 04/20/23

## 2023-04-20 NOTE — Patient Instructions (Signed)

## 2023-04-21 ENCOUNTER — Ambulatory Visit (HOSPITAL_BASED_OUTPATIENT_CLINIC_OR_DEPARTMENT_OTHER)
Admission: RE | Admit: 2023-04-21 | Discharge: 2023-04-21 | Disposition: A | Discharge status: Home / Self Care | Source: Ambulatory Visit | Attending: Acute Care | Admitting: Acute Care

## 2023-04-21 DIAGNOSIS — Z87891 Personal history of nicotine dependence: Secondary | ICD-10-CM | POA: Diagnosis not present

## 2023-04-21 DIAGNOSIS — F17210 Nicotine dependence, cigarettes, uncomplicated: Secondary | ICD-10-CM | POA: Diagnosis not present

## 2023-04-21 DIAGNOSIS — Z122 Encounter for screening for malignant neoplasm of respiratory organs: Secondary | ICD-10-CM | POA: Insufficient documentation

## 2023-05-28 ENCOUNTER — Other Ambulatory Visit: Payer: Self-pay | Admitting: Family Medicine

## 2023-05-28 DIAGNOSIS — K219 Gastro-esophageal reflux disease without esophagitis: Secondary | ICD-10-CM

## 2023-06-06 ENCOUNTER — Other Ambulatory Visit: Payer: Self-pay | Admitting: Family Medicine

## 2023-06-06 DIAGNOSIS — I10 Essential (primary) hypertension: Secondary | ICD-10-CM

## 2023-06-23 ENCOUNTER — Other Ambulatory Visit: Payer: Self-pay | Admitting: Family Medicine

## 2023-06-23 DIAGNOSIS — K219 Gastro-esophageal reflux disease without esophagitis: Secondary | ICD-10-CM

## 2023-06-26 IMAGING — US US ABDOMEN COMPLETE
1 series · 14 of 25 positions shown · non-contrast
Comparison: CT 06/21/2008 report

CLINICAL DATA: Right upper quadrant pain

EXAM:
ABDOMEN ULTRASOUND COMPLETE

[Series 1: us abdomen complete · 14 of 72 slices shown]
[im 1/72]
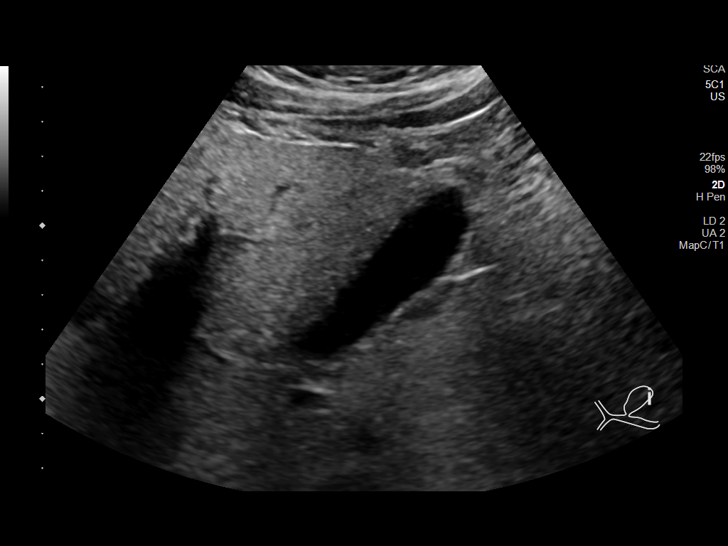
[im 6/72]
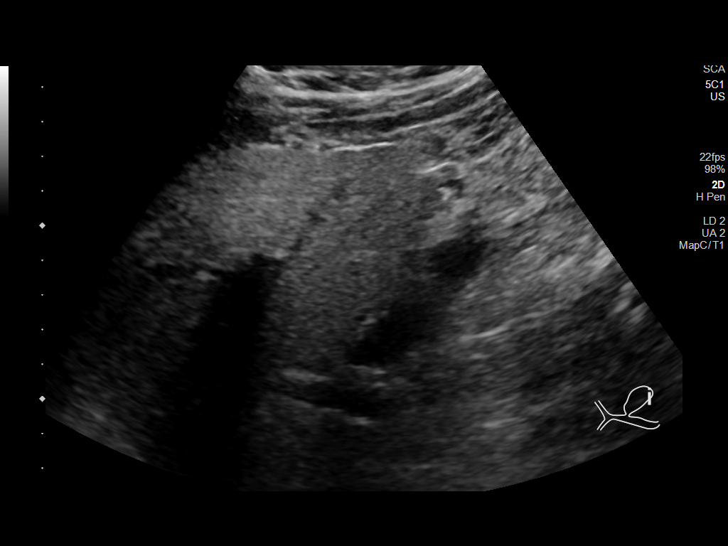
[im 12/72]
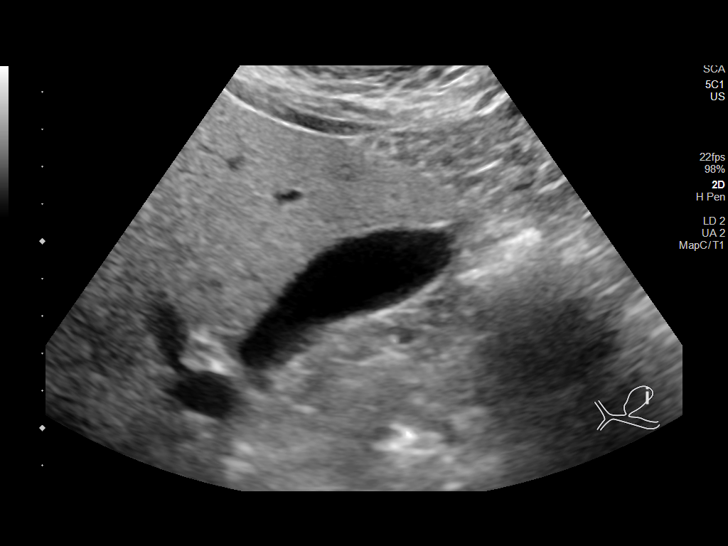
[im 18/72]
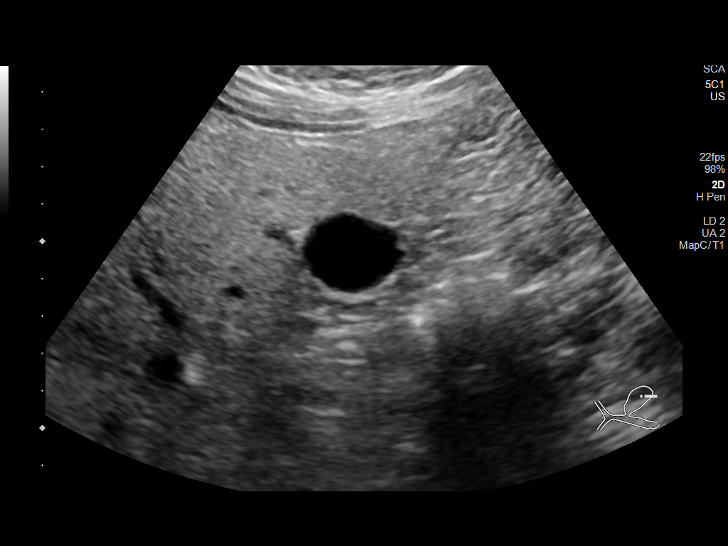
[im 24/72]
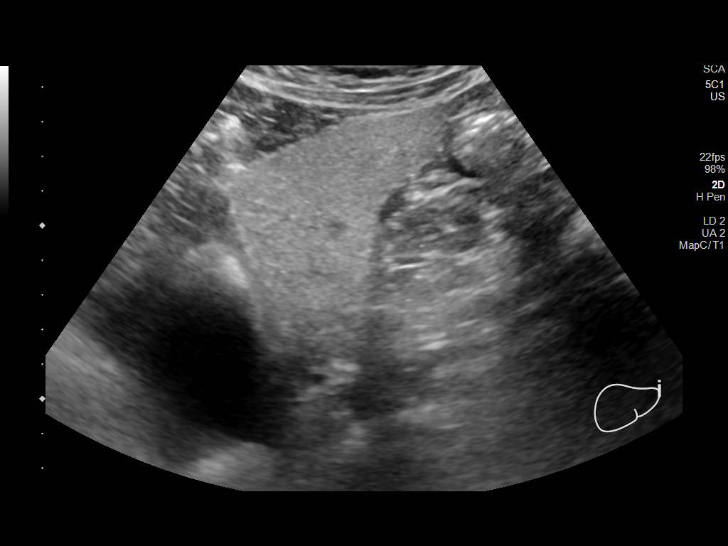
[im 27/72]
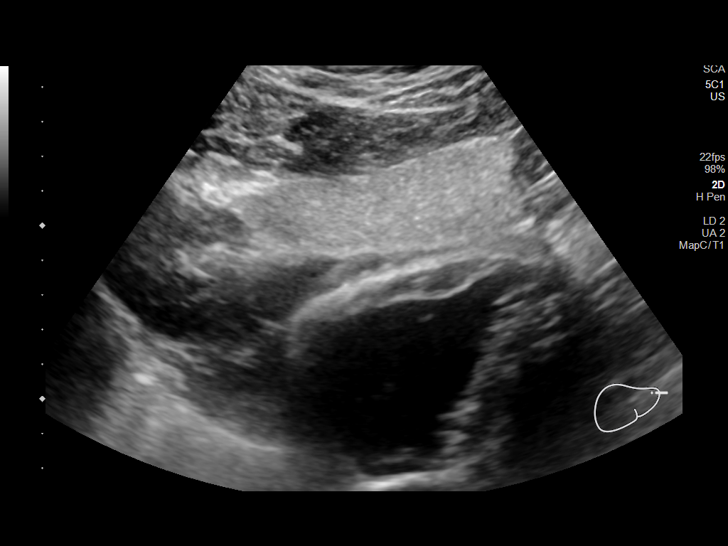
[im 33/72]
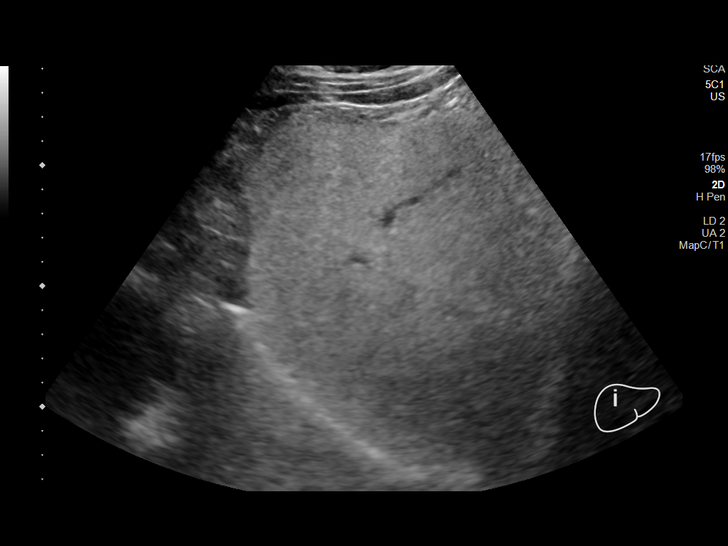
[im 39/72]
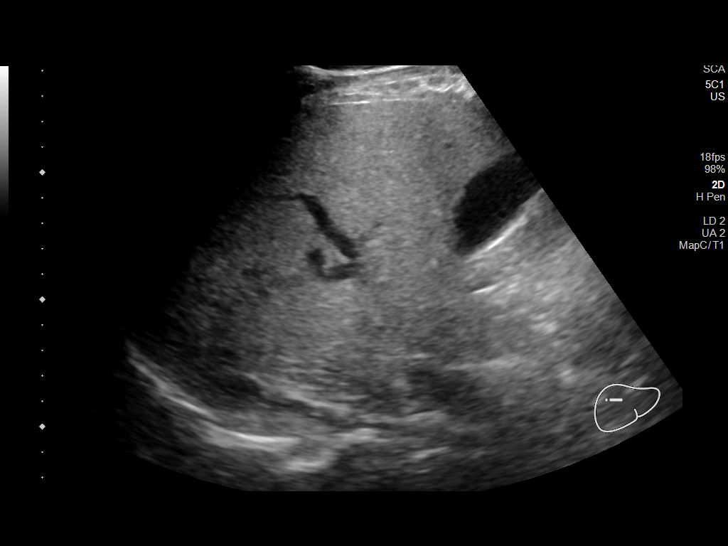
[im 45/72]
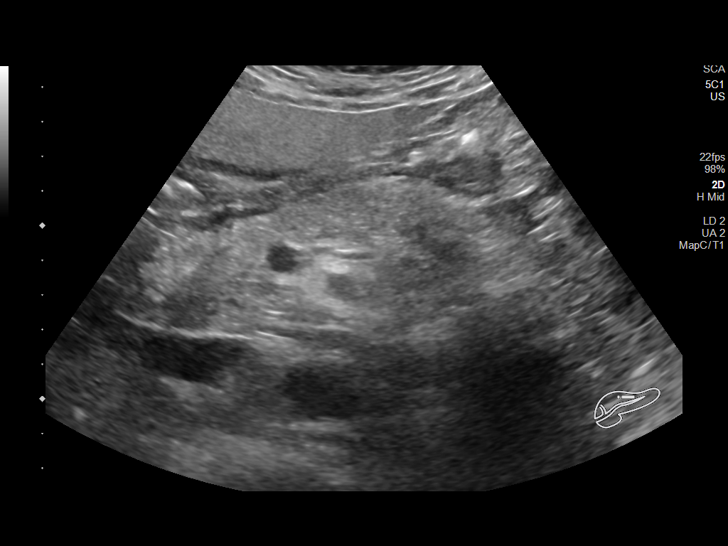
[im 48/72]
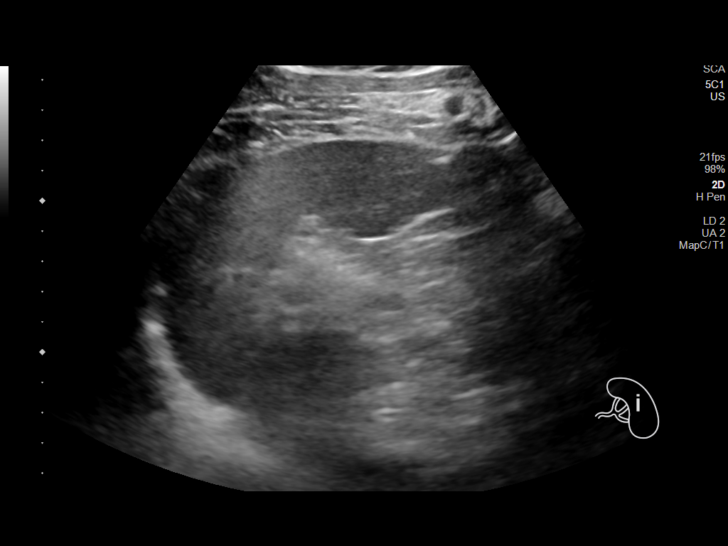
[im 54/72]
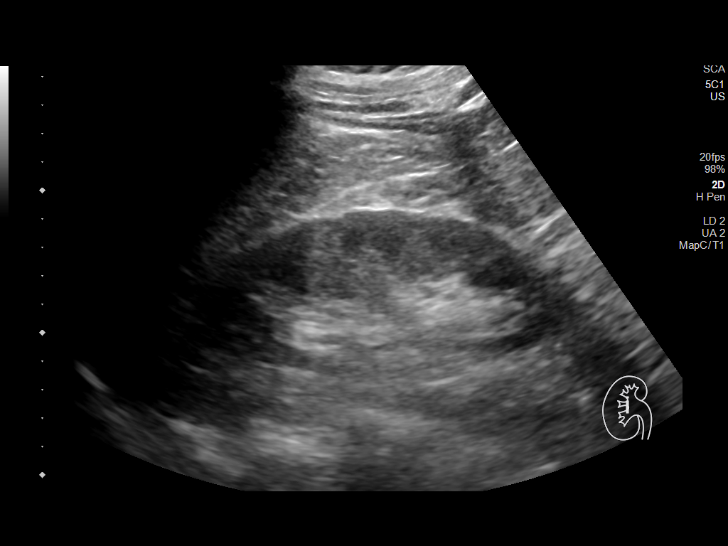
[im 60/72]
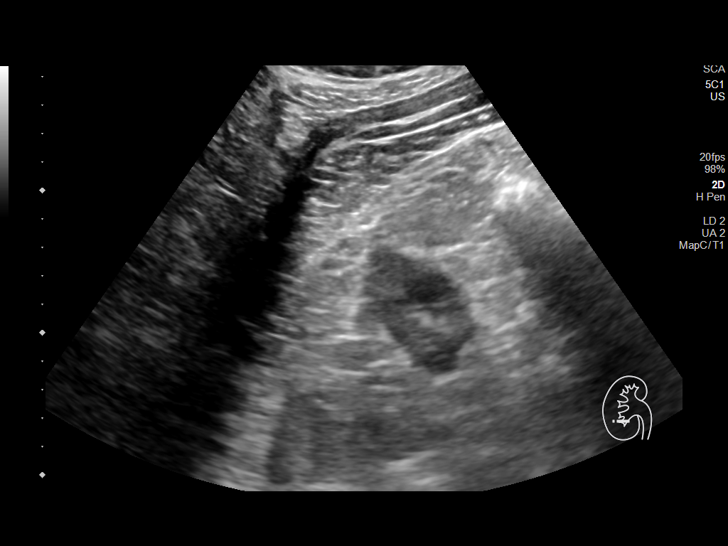
[im 66/72]
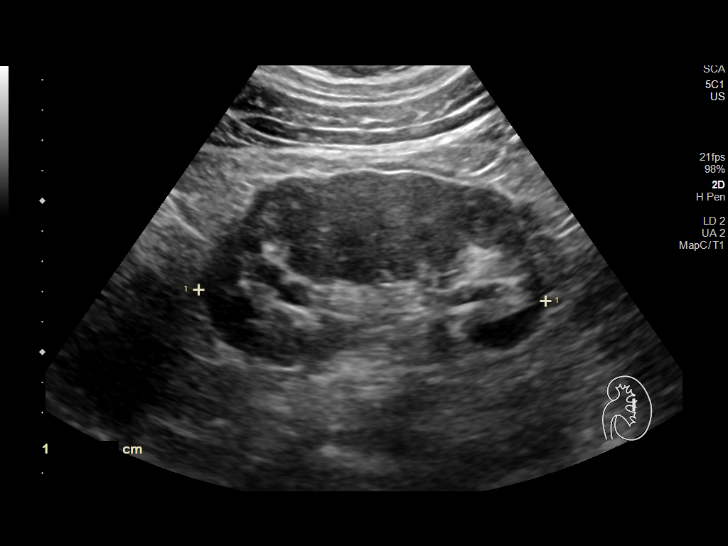
[im 72/72]
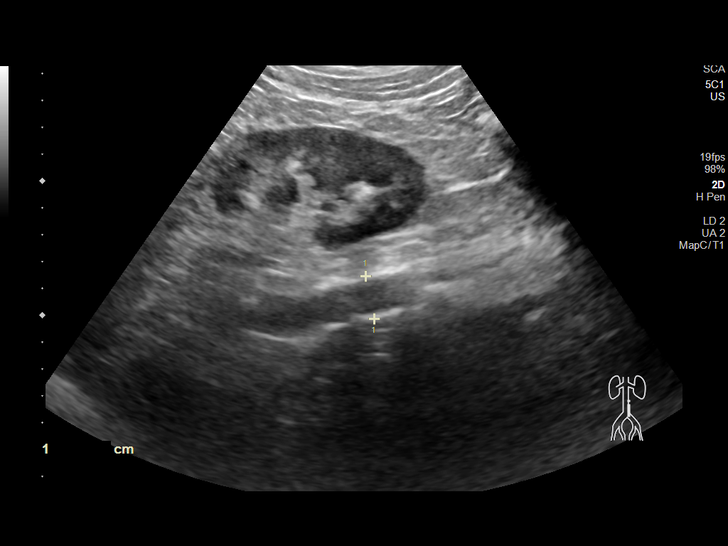

[14 of 25 positions shown; findings below may reference images not displayed]

FINDINGS: Gallbladder: No gallstones or wall thickening visualized. No
sonographic Murphy sign noted by sonographer.

Common bile duct: Diameter: 3.2 mm

Liver: Diffusely echogenic. Focal hypoechoic region near gallbladder
fossa. Portal vein is patent on color Doppler imaging with normal
direction of blood flow towards the liver.

IVC: No abnormality visualized.

Pancreas: Visualized portion unremarkable.

Spleen: Size and appearance within normal limits.

Right Kidney: Length: 13 cm. Echogenicity within normal limits. No
mass or hydronephrosis visualized.

Left Kidney: Length: 11.5 cm. Echogenicity within normal limits. No
mass or hydronephrosis visualized.

Abdominal aorta: No aneurysm visualized.

Other findings: None.
IMPRESSION: 1. Echogenic liver consistent with hepatic steatosis and or
hepatocellular disease with probable fat sparing near the
gallbladder fossa.
2. Negative for gallstones.

## 2023-06-29 ENCOUNTER — Encounter: Payer: Self-pay | Admitting: Family Medicine

## 2023-06-29 ENCOUNTER — Ambulatory Visit (INDEPENDENT_AMBULATORY_CARE_PROVIDER_SITE_OTHER): Admitting: Family Medicine

## 2023-06-29 VITALS — BP 140/80 | HR 76 | Temp 96.7°F | Ht 68.0 in | Wt 223.4 lb

## 2023-06-29 DIAGNOSIS — E7800 Pure hypercholesterolemia, unspecified: Secondary | ICD-10-CM

## 2023-06-29 DIAGNOSIS — F418 Other specified anxiety disorders: Secondary | ICD-10-CM | POA: Diagnosis not present

## 2023-06-29 DIAGNOSIS — K760 Fatty (change of) liver, not elsewhere classified: Secondary | ICD-10-CM

## 2023-06-29 DIAGNOSIS — I10 Essential (primary) hypertension: Secondary | ICD-10-CM | POA: Diagnosis not present

## 2023-06-29 DIAGNOSIS — R7309 Other abnormal glucose: Secondary | ICD-10-CM

## 2023-06-29 DIAGNOSIS — F5105 Insomnia due to other mental disorder: Secondary | ICD-10-CM

## 2023-06-29 DIAGNOSIS — R499 Unspecified voice and resonance disorder: Secondary | ICD-10-CM | POA: Insufficient documentation

## 2023-06-29 DIAGNOSIS — F99 Mental disorder, not otherwise specified: Secondary | ICD-10-CM

## 2023-06-29 DIAGNOSIS — Z720 Tobacco use: Secondary | ICD-10-CM | POA: Diagnosis not present

## 2023-06-29 MED ORDER — MIRTAZAPINE 7.5 MG PO TABS: 7.5000 mg | ORAL_TABLET | Freq: Every day | ORAL | 2 refills | Status: DC

## 2023-06-29 NOTE — Progress Notes (Signed)
 Established Patient Office Visit   Subjective:  Patient ID: Wayne Wheeler, male    DOB: [birth year]  Age: 68 y.o. MRN: [id number]  Chief Complaint  Patient presents with   Follow-up    3 month follow up    throat issue    Feel like something is stuck in throat for 2 weeks-no pain     HPI Encounter Diagnoses  Name Primary?   Depression with anxiety Yes   Tobacco use    Essential hypertension    Change in voice    Insomnia due to other mental disorder    Elevated LDL cholesterol level    For follow-up of above.  He quit smoking yesterday and is determined to quit.  He continues lisinopril  40 mg and HCTZ for control of his blood pressure.  He has noticed change in his voice.  There has been no difficulty chewing and swallowing food.  Has been having difficulty sleeping.  Feels a little frustrated with things right now.  Seems to be a lot of people who need his help.   Review of Systems  Constitutional: Negative.   HENT: Negative.    Eyes:  Negative for blurred vision, discharge and redness.  Respiratory: Negative.    Cardiovascular: Negative.   Gastrointestinal:  Negative for abdominal pain.  Genitourinary: Negative.   Musculoskeletal: Negative.  Negative for myalgias.  Skin:  Negative for rash.  Neurological:  Negative for tingling, loss of consciousness and weakness.  Endo/Heme/Allergies:  Negative for polydipsia.     Current Outpatient Medications:    aspirin  EC 81 MG tablet, Take 81 mg by mouth daily. Swallow whole., Disp: , Rfl:    clopidogrel  (PLAVIX ) 75 MG tablet, Take 1 tablet (75 mg total) by mouth daily with breakfast., Disp: 90 tablet, Rfl: 2   cyanocobalamin 1000 MCG tablet, Take 1,000 mcg by mouth daily. (Patient taking differently: Take 1,000 mcg by mouth daily.), Disp: , Rfl:    ezetimibe  (ZETIA ) 10 MG tablet, Take 1 tablet (10 mg total) by mouth daily., Disp: 90 tablet, Rfl: 3   gabapentin  (NEURONTIN ) 300 MG capsule, Take 1 capsule by mouth twice daily,  Disp: 60 capsule, Rfl: 5   hydrochlorothiazide  (HYDRODIURIL ) 25 MG tablet, Take 1 tablet by mouth once daily, Disp: 90 tablet, Rfl: 0   lisinopril  (ZESTRIL ) 40 MG tablet, Take 1 tablet by mouth once daily, Disp: 90 tablet, Rfl: 0   mirtazapine (REMERON) 7.5 MG tablet, Take 1 tablet (7.5 mg total) by mouth at bedtime., Disp: 30 tablet, Rfl: 2   nitroGLYCERIN  (NITROSTAT ) 0.4 MG SL tablet, Place 1 tablet (0.4 mg total) under the tongue every 5 (five) minutes as needed., Disp: 25 tablet, Rfl: 2   pantoprazole  (PROTONIX ) 40 MG tablet, Take 1 tablet by mouth once daily, Disp: 90 tablet, Rfl: 0   simvastatin  (ZOCOR ) 40 MG tablet, TAKE 1 TABLET BY MOUTH AT BEDTIME, Disp: 90 tablet, Rfl: 0   umeclidinium-vilanterol (ANORO ELLIPTA ) 62.5-25 MCG/ACT AEPB, Inhale 1 puff into the lungs daily. (Patient not taking: Reported on 06/29/2023), Disp: 90 each, Rfl: 3   varenicline  (CHANTIX ) 1 MG tablet, Take 1 tablet (1 mg total) by mouth 2 (two) times daily. (Patient not taking: Reported on 06/29/2023), Disp: 60 tablet, Rfl: 2   Objective:     BP (!) 140/80 (BP Location: Left Arm, Patient Position: Sitting, Cuff Size: Normal)   Pulse 76   Temp (!) 96.7 F (35.9 C) (Temporal)   Ht 5' 8 (1.727 m)   Wt  223 lb 6.4 oz (101.3 kg)   SpO2 97%   BMI 33.97 kg/m  BP Readings from Last 3 Encounters:  06/29/23 (!) 140/80  10/11/22 135/74  09/19/22 120/70   Wt Readings from Last 3 Encounters:  06/29/23 223 lb 6.4 oz (101.3 kg)  10/11/22 215 lb 6.4 oz (97.7 kg)  09/19/22 214 lb (97.1 kg)      Physical Exam Constitutional:      General: He is not in acute distress.    Appearance: Normal appearance. He is not ill-appearing, toxic-appearing or diaphoretic.  HENT:     Head: Normocephalic and atraumatic.     Right Ear: External ear normal.     Left Ear: External ear normal.     Mouth/Throat:     Mouth: Mucous membranes are moist.     Pharynx: Oropharynx is clear. No oropharyngeal exudate or posterior oropharyngeal  erythema.   Eyes:     General: No scleral icterus.       Right eye: No discharge.        Left eye: No discharge.     Extraocular Movements: Extraocular movements intact.     Conjunctiva/sclera: Conjunctivae normal.     Pupils: Pupils are equal, round, and reactive to light.    Cardiovascular:     Rate and Rhythm: Normal rate and regular rhythm.  Pulmonary:     Effort: Pulmonary effort is normal. No respiratory distress.     Breath sounds: Normal breath sounds. No wheezing, rhonchi or rales.  Abdominal:     General: Bowel sounds are normal.   Musculoskeletal:     Cervical back: No rigidity or tenderness.  Lymphadenopathy:     Cervical: No cervical adenopathy.   Skin:    General: Skin is warm and dry.   Neurological:     Mental Status: He is alert and oriented to person, place, and time.   Psychiatric:        Mood and Affect: Mood normal.        Behavior: Behavior normal.      No results found for any visits on 06/29/23.    The 10-year ASCVD risk score (Arnett DK, et al., 2019) is: 19.5%    Assessment & Plan:   Depression with anxiety -     Mirtazapine; Take 1 tablet (7.5 mg total) by mouth at bedtime.  Dispense: 30 tablet; Refill: 2  Tobacco use  Essential hypertension -     CBC -     Comprehensive metabolic panel with GFR  Change in voice -     Ambulatory referral to ENT  Insomnia due to other mental disorder -     Mirtazapine; Take 1 tablet (7.5 mg total) by mouth at bedtime.  Dispense: 30 tablet; Refill: 2  Elevated LDL cholesterol level -     Comprehensive metabolic panel with GFR -     LDL cholesterol, direct    Return in about 3 months (around 09/29/2023).  Will try lower dose mirtazapine 7.5 mg nightly for sleep.  ENT referral for voice change with history of tobacco use.  Congratulated him for quitting smoking.  Information was given on managing hypertension.  Tonna Frederic, MD

## 2023-06-30 LAB — COMPREHENSIVE METABOLIC PANEL WITH GFR
ALT: 71 U/L — ABNORMAL HIGH (ref 0–53)
AST: 51 U/L — ABNORMAL HIGH (ref 0–37)
Albumin: 4.4 g/dL (ref 3.5–5.2)
Alkaline Phosphatase: 67 U/L (ref 39–117)
BUN: 14 mg/dL (ref 6–23)
CO2: 26 meq/L (ref 19–32)
Calcium: 9.3 mg/dL (ref 8.4–10.5)
Chloride: 101 meq/L (ref 96–112)
Creatinine, Ser: 0.96 mg/dL (ref 0.40–1.50)
GFR: 81.63 mL/min (ref >60.00)
Glucose, Bld: 216 mg/dL — ABNORMAL HIGH (ref 70–99)
Potassium: 4.2 meq/L (ref 3.5–5.1)
Sodium: 135 meq/L (ref 135–145)
Total Bilirubin: 0.6 mg/dL (ref 0.2–1.2)
Total Protein: 6.8 g/dL (ref 6.0–8.3)

## 2023-06-30 LAB — CBC
HCT: 42 % (ref 39.0–52.0)
Hemoglobin: 14.7 g/dL (ref 13.0–17.0)
MCHC: 35 g/dL (ref 30.0–36.0)
MCV: 94.5 fl (ref 78.0–100.0)
Platelets: 137 10*3/uL — ABNORMAL LOW (ref 150.0–400.0)
RBC: 4.45 Mil/uL (ref 4.22–5.81)
RDW: 13.7 % (ref 11.5–15.5)
WBC: 7.1 10*3/uL (ref 4.0–10.5)

## 2023-06-30 LAB — LDL CHOLESTEROL, DIRECT: Direct LDL: 71 mg/dL

## 2023-07-04 ENCOUNTER — Ambulatory Visit: Payer: Self-pay | Admitting: Family Medicine

## 2023-07-04 ENCOUNTER — Other Ambulatory Visit: Payer: Self-pay | Admitting: Acute Care

## 2023-07-04 DIAGNOSIS — Z122 Encounter for screening for malignant neoplasm of respiratory organs: Secondary | ICD-10-CM

## 2023-07-04 DIAGNOSIS — Z87891 Personal history of nicotine dependence: Secondary | ICD-10-CM

## 2023-07-04 DIAGNOSIS — F17210 Nicotine dependence, cigarettes, uncomplicated: Secondary | ICD-10-CM

## 2023-07-04 NOTE — Addendum Note (Signed)
 Addended by: BERNETA ELSIE LABOR on: 07/04/2023 08:01 AM   Modules accepted: Orders

## 2023-08-03 ENCOUNTER — Other Ambulatory Visit: Payer: Self-pay | Admitting: Family Medicine

## 2023-08-03 DIAGNOSIS — E7800 Pure hypercholesterolemia, unspecified: Secondary | ICD-10-CM

## 2023-09-18 ENCOUNTER — Other Ambulatory Visit: Payer: Self-pay | Admitting: Family Medicine

## 2023-09-18 DIAGNOSIS — I10 Essential (primary) hypertension: Secondary | ICD-10-CM

## 2023-09-19 ENCOUNTER — Other Ambulatory Visit: Payer: Self-pay | Admitting: Family Medicine

## 2023-09-19 ENCOUNTER — Other Ambulatory Visit: Payer: Self-pay | Admitting: Cardiology

## 2023-09-19 DIAGNOSIS — K219 Gastro-esophageal reflux disease without esophagitis: Secondary | ICD-10-CM

## 2023-09-19 DIAGNOSIS — E782 Mixed hyperlipidemia: Secondary | ICD-10-CM

## 2023-09-19 DIAGNOSIS — I2510 Atherosclerotic heart disease of native coronary artery without angina pectoris: Secondary | ICD-10-CM

## 2023-09-19 DIAGNOSIS — I10 Essential (primary) hypertension: Secondary | ICD-10-CM

## 2023-09-20 ENCOUNTER — Ambulatory Visit (INDEPENDENT_AMBULATORY_CARE_PROVIDER_SITE_OTHER): Admitting: Otolaryngology

## 2023-09-20 ENCOUNTER — Encounter (INDEPENDENT_AMBULATORY_CARE_PROVIDER_SITE_OTHER): Payer: Self-pay | Admitting: Otolaryngology

## 2023-09-20 VITALS — BP 136/83 | HR 68

## 2023-09-20 DIAGNOSIS — R0982 Postnasal drip: Secondary | ICD-10-CM

## 2023-09-20 DIAGNOSIS — R0981 Nasal congestion: Secondary | ICD-10-CM | POA: Diagnosis not present

## 2023-09-20 DIAGNOSIS — J3089 Other allergic rhinitis: Secondary | ICD-10-CM

## 2023-09-20 DIAGNOSIS — J383 Other diseases of vocal cords: Secondary | ICD-10-CM

## 2023-09-20 DIAGNOSIS — Z720 Tobacco use: Secondary | ICD-10-CM

## 2023-09-20 DIAGNOSIS — R432 Parageusia: Secondary | ICD-10-CM

## 2023-09-20 DIAGNOSIS — Z87891 Personal history of nicotine dependence: Secondary | ICD-10-CM | POA: Diagnosis not present

## 2023-09-20 DIAGNOSIS — R490 Dysphonia: Secondary | ICD-10-CM | POA: Diagnosis not present

## 2023-09-20 DIAGNOSIS — R070 Pain in throat: Secondary | ICD-10-CM

## 2023-09-20 DIAGNOSIS — K219 Gastro-esophageal reflux disease without esophagitis: Secondary | ICD-10-CM

## 2023-09-20 NOTE — Patient Instructions (Addendum)
 Dear Sharolyn CHRISTELLA Dustman,   Congratulations for your interest in quitting smoking!  Find a program that suits you best: when you want to quit, how you need support, where you live, and how you like to learn.    If you're ready to get started TODAY, consider scheduling a visit through [facility] [email or web address].  Appointments are available from 8am to 8pm, Monday to Friday.   Most health insurance plans will cover some level of tobacco cessation visits and medications.    Additional Resources: [employer] are also available to help you quit & provide the support you'll need. Many programs are available in both [place] and Spanish and have a long history of successfully helping people get off and stay off tobacco.    Quit Smoking Apps:  quitSTART at [employer].de QuitGuide?at ForgetParking.dk Online education and resources: Smokefree  at [employer].gov Free Telephone Coaching: QuitNow,  Call 1-800-QUIT-NOW (585 414 0323) or Text- Ready to 913-626-1442 *Quitline [employer] has teamed up with Medicaid to offer a free 14 week program    Vaping- Want to Quit? Free 24/7 support. Call [facility]  [place], [place], [place], [place], [place]  [place]  GamingLesson.nl - check out this website to learn more about reflux   -Avoid lying down for at least two hours after a meal or after drinking acidic beverages, like soda, or other caffeinated beverages. This can help to prevent stomach contents from flowing back into the esophagus. -Keep your head elevated while you sleep. Using an extra pillow or two can also help to prevent reflux. -Eat smaller and more frequent meals each day instead of a few large meals. This promotes digestion and can aid in preventing heartburn. -Wear loose-fitting clothes to ease pressure on the stomach, which can worsen heartburn and reflux. -Reduce excess weight around the midsection. This can ease  pressure on the stomach. Such pressure can force some stomach contents back up the esophagus - Take Reflux Gourmet (natural supplement available on [employer]) to help with symptoms of chronic throat irritation    .

## 2023-09-20 NOTE — Progress Notes (Addendum)
 ENT CONSULT:  Reason for Consult: chronic dysphonia  hx of smoking   HPI: Discussed the use of AI scribe software for clinical note transcription with the patient, who gave verbal consent to proceed.  History of Present Illness Wayne Wheeler is a 68 year old male who presents with changes in voice and loss of taste.  He has been experiencing changes in his voice, describing it as 'talking a little froggy.' These symptoms have been ongoing for a while and he associates their onset with his use of gabapentin , which he has since discontinued. Initially, he felt a sensation of pressure in his throat, which seems to be subsiding. He has been taking a spoonful of honey, turmeric, and pepper daily for the past couple of weeks, which he feels has helped alleviate the pressure in his throat.  He also reports a loss of sense of taste, which he noticed after stopping gabapentin . He is unsure about any changes in his sense of smell, stating 'I ain't thought about it.'  He has a history of smoking and currently smokes, although he wants to quit. He previously used Chantix  successfully to stop smoking but relapsed due to stress. A recent attempt to use Chantix  again was unsuccessful.  He experiences occasional heartburn and takes medication for it, noting that missing a dose results in bad heartburn. No postnasal drainage but mentions congestion.  He is 'a little hard of hearing' and experiences popping in his ear, which he attributes to possible wax or water from swimming. He notes that one ear canal feels smaller and bothers him more than the other.   Records Reviewed:  Office visit with Dr Berneta 06/29/23 For follow-up of above. He quit smoking yesterday and is determined to quit. He continues lisinopril  40 mg and HCTZ for control of his blood pressure. He has noticed change in his voice. There has been no difficulty chewing and swallowing food. Has been having difficulty sleeping. Feels a little  frustrated with things right now. Seems to be a lot of people who need his help.     Past Medical History:  Diagnosis Date   Depression    Dyslipidemia    Hypertension    Resting tremor     Past Surgical History:  Procedure Laterality Date   CORONARY STENT INTERVENTION N/A 09/07/2022   Procedure: CORONARY STENT INTERVENTION;  Surgeon: Dann Candyce RAMAN, MD;  [facility]VAS[facility] CV LAB;  Service: Cardiovascular;  Laterality: N/A;   LEFT HEART CATH AND CORONARY ANGIOGRAPHY N/A 09/07/2022   Procedure: LEFT HEART CATH AND CORONARY ANGIOGRAPHY;  Surgeon: Dann Candyce RAMAN, MD;  Location: [facility] INVASIVE CV LAB;  Service: Cardiovascular;  Laterality: N/A;   SHOULDER SURGERY Right 06/10/2009    Family History  Problem Relation Age of Onset   Depression Mother    Heart disease Father     Social History:  reports that he is a current smoker, tried to quit smoking about 8 months ago, but still smokes. His smoking use included cigarettes. He has never used smokeless tobacco. He reports current alcohol use of about 1.0 standard drink of alcohol per week. He reports that he does not use drugs.  Allergies:  Allergies  Allergen Reactions   Prednisone Other (See Comments)    Dyspepsia    Medications: I have reviewed the patient's current medications.  The PMH, PSH, Medications, Allergies, and SH were reviewed and updated.  ROS: Constitutional: Negative for fever, weight loss and weight gain. Cardiovascular: Negative for chest pain  and dyspnea on exertion. Respiratory: Is not experiencing shortness of breath at rest. Gastrointestinal: Negative for nausea and vomiting. Neurological: Negative for headaches. Psychiatric: The patient is not nervous/anxious  Blood pressure 136/83, pulse 68, SpO2 96%. There is no height or weight on file to calculate BMI.  PHYSICAL EXAM:  Exam: General: Well-developed, well-nourished Communication and Voice: raspy Respiratory Respiratory effort:  Equal inspiration and expiration without stridor Cardiovascular Peripheral Vascular: Warm extremities with equal color/perfusion Eyes: No nystagmus with equal extraocular motion bilaterally Neuro/Psych/Balance: Patient oriented to person, place, and time; Appropriate mood and affect; Gait is intact with no imbalance; Cranial nerves I-XII are intact Head and Face Inspection: Normocephalic and atraumatic without mass or lesion Palpation: Facial skeleton intact without bony stepoffs Salivary Glands: No mass or tenderness Facial Strength: Facial motility symmetric and full bilaterally ENT Pinna: External ear intact and fully developed External canal: Canal is patent with intact skin Tympanic Membrane: Clear and mobile External Nose: No scar or anatomic deformity Internal Nose: Septum is deviated to the left. No polyp, or purulence. Mucosal edema and erythema present.  Bilateral inferior turbinate hypertrophy.  Lips, Teeth, and gums: Mucosa and teeth intact and viable TMJ: No pain to palpation with full mobility Oral cavity/oropharynx: No erythema or exudate, no lesions present Nasopharynx: No mass or lesion with intact mucosa Hypopharynx: Intact mucosa without pooling of secretions Larynx Glottic: Full true vocal cord mobility without lesion or mass Supraglottic: Normal appearing epiglottis and AE folds Interarytenoid Space: Moderate pachydermia&edema Subglottic Space: Patent without lesion or edema Neck Neck and Trachea: Midline trachea without mass or lesion Thyroid : No mass or nodularity Lymphatics: No lymphadenopathy  Procedure: Preoperative diagnosis: dysphonia, hx of smoking  Postoperative diagnosis:   Same + GERD LPR VF atrophy  Procedure: Flexible fiberoptic laryngoscopy  Surgeon: Elena Larry, MD  Anesthesia: Topical lidocaine  and Afrin Complications: None Condition is stable throughout exam  Indications and consent:  The patient presents to the clinic with above  symptoms. Indirect laryngoscopy view was incomplete. Thus it was recommended that they undergo a flexible fiberoptic laryngoscopy. All of the risks, benefits, and potential complications were reviewed with the patient preoperatively and verbal informed consent was obtained.  Procedure: The patient was seated upright in the clinic. Topical lidocaine  and Afrin were applied to the nasal cavity. After adequate anesthesia had occurred, I then proceeded to pass the flexible telescope into the nasal cavity. The nasal cavity was patent without rhinorrhea or polyp. The nasopharynx was also patent without mass or lesion. The base of tongue was visualized and was normal. There were no signs of pooling of secretions in the piriform sinuses. The true vocal folds were mobile bilaterally. There were no signs of glottic or supraglottic mucosal lesion or mass. There was moderate interarytenoid pachydermia and post cricoid edema. The telescope was then slowly withdrawn and the patient tolerated the procedure throughout.    Studies Reviewed: CT chest 04/21/23 1. Lung-RADS 2, benign appearance or behavior. Continue annual screening with low-dose chest CT without contrast in 12 months. 2. Hepatic steatosis. 3. Aortic atherosclerosis (ICD10-I70.0). Coronary artery calcification. 4. Enlarged pulmonic trunk, indicative of pulmonary arterial hypertension  Assessment/Plan: Encounter Diagnoses  Name Primary?   Glottic insufficiency    Dysphonia Yes   Hoarseness    Age-related vocal fold atrophy    Throat discomfort    Tobacco use    Chronic GERD    Environmental and seasonal allergies    Chronic nasal congestion    Post-nasal drip    Decreased  sense of taste     Assessment and Plan Assessment & Plan Chronic Dysphonia Hx of smoking Chronic voice changes likely due to postnasal drainage, reflux, vocal cord atrophy and smoking - discussed smoking cessation - continue current GERD medication  - offered allergy  medications to control post-nasal drainage, but he would like to hold off  Chronic nasal congestion Nasal congestion possibly exacerbated by smoking and environmental allergies  - consider OTC allergy medications - currently would like to hold off  - smoking cessation   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  - monitor for now   Gastroesophageal reflux disease LPR GERD well-managed with medication. Heartburn occurs if a dose is missed. - Continue Protonix  40 mg daily -  Reflux Gourmet after meals - diet and lifestyle changes to minimize GERD - Refer to [employer] blog for dietary and lifestyle modifications/reflux cook book  Tobacco use disorder.  We had an extensive discussion about detrimental effects of smoking on overall health. I provided resources available at [facility] to assist with smoking cessation. I spent 4 min on counseling  - Advised smoking cessation   Thank you for allowing me to participate in the care of this patient. Please do not hesitate to contact me with any questions or concerns.   Elena Larry, MD Otolaryngology [facility] Phone: [phone] Fax: [phone]    09/20/2023, 2:56 PM

## 2023-09-28 ENCOUNTER — Other Ambulatory Visit: Payer: Self-pay | Admitting: Family Medicine

## 2023-09-28 ENCOUNTER — Ambulatory Visit (INDEPENDENT_AMBULATORY_CARE_PROVIDER_SITE_OTHER): Admitting: Family Medicine

## 2023-09-28 ENCOUNTER — Encounter: Payer: Self-pay | Admitting: Family Medicine

## 2023-09-28 VITALS — BP 132/78 | HR 78 | Temp 96.9°F | Ht 68.0 in | Wt 215.2 lb

## 2023-09-28 DIAGNOSIS — F418 Other specified anxiety disorders: Secondary | ICD-10-CM

## 2023-09-28 DIAGNOSIS — F5105 Insomnia due to other mental disorder: Secondary | ICD-10-CM

## 2023-09-28 DIAGNOSIS — R7309 Other abnormal glucose: Secondary | ICD-10-CM | POA: Diagnosis not present

## 2023-09-28 DIAGNOSIS — K760 Fatty (change of) liver, not elsewhere classified: Secondary | ICD-10-CM | POA: Diagnosis not present

## 2023-09-28 DIAGNOSIS — I10 Essential (primary) hypertension: Secondary | ICD-10-CM | POA: Diagnosis not present

## 2023-09-28 DIAGNOSIS — E1165 Type 2 diabetes mellitus with hyperglycemia: Secondary | ICD-10-CM

## 2023-09-28 DIAGNOSIS — Z7984 Long term (current) use of oral hypoglycemic drugs: Secondary | ICD-10-CM

## 2023-09-28 DIAGNOSIS — R7989 Other specified abnormal findings of blood chemistry: Secondary | ICD-10-CM

## 2023-09-28 DIAGNOSIS — E538 Deficiency of other specified B group vitamins: Secondary | ICD-10-CM | POA: Diagnosis not present

## 2023-09-28 DIAGNOSIS — Z125 Encounter for screening for malignant neoplasm of prostate: Secondary | ICD-10-CM | POA: Diagnosis not present

## 2023-09-28 MED ORDER — MIRTAZAPINE 30 MG PO TABS: 30.0000 mg | ORAL_TABLET | Freq: Every day | ORAL | 0 refills | Status: AC

## 2023-09-28 MED ORDER — MIRTAZAPINE 30 MG PO TABS: 30.0000 mg | ORAL_TABLET | Freq: Every day | ORAL | 0 refills | Status: DC

## 2023-09-28 NOTE — Progress Notes (Signed)
 Established Patient Office Visit   Subjective:  Patient ID: Wayne Wheeler, male    DOB: Mar 01, 1955  Age: 68 y.o. MRN: [id number]  Chief Complaint  Patient presents with   Medical Management of Chronic Issues    3 month follow up.     HPI Encounter Diagnoses  Name Primary?   Depression with anxiety Yes   Hepatic steatosis    Elevated glucose    Elevated LFTs    Screening for prostate cancer    Essential hypertension    B12 deficiency    For follow-up of above.  Mirtazapine  use at 7.5 mg nightly did help his sleep some.  Sleep was better when he took 2 tablets instead of 1.  He has been able to lose some weight.  Blood pressure is improved.  He has mostly quit smoking but continues to have 1 on occasion.  Discontinued B12 because of something he read on the Internet.   Review of Systems  Constitutional: Negative.   HENT: Negative.    Eyes:  Negative for blurred vision, discharge and redness.  Respiratory: Negative.    Cardiovascular: Negative.   Gastrointestinal:  Negative for abdominal pain.  Genitourinary: Negative.   Musculoskeletal: Negative.  Negative for myalgias.  Skin:  Negative for rash.  Neurological:  Negative for tingling, loss of consciousness and weakness.  Endo/Heme/Allergies:  Negative for polydipsia.     Current Outpatient Medications:    aspirin  EC 81 MG tablet, Take 81 mg by mouth daily. Swallow whole., Disp: , Rfl:    clopidogrel  (PLAVIX ) 75 MG tablet, Take 1 tablet by mouth once daily with breakfast, Disp: 30 tablet, Rfl: 0   ezetimibe  (ZETIA ) 10 MG tablet, Take 1 tablet by mouth once daily, Disp: 30 tablet, Rfl: 0   hydrochlorothiazide  (HYDRODIURIL ) 25 MG tablet, Take 1 tablet by mouth once daily, Disp: 90 tablet, Rfl: 0   lisinopril  (ZESTRIL ) 40 MG tablet, TAKE 1 TABLET BY MOUTH ONCE DAILY *NEEDS APPOINTMENT FOR FURTHER REFILLS*, Disp: 90 tablet, Rfl: 0   pantoprazole  (PROTONIX ) 40 MG tablet, Take 1 tablet by mouth once daily, Disp: 90 tablet, Rfl:  0   simvastatin  (ZOCOR ) 40 MG tablet, TAKE 1 TABLET BY MOUTH AT BEDTIME, Disp: 90 tablet, Rfl: 0   cyanocobalamin 1000 MCG tablet, Take 1,000 mcg by mouth daily. (Patient taking differently: Take 1,000 mcg by mouth daily.), Disp: , Rfl:    gabapentin  (NEURONTIN ) 300 MG capsule, Take 1 capsule by mouth twice daily (Patient not taking: Reported on 09/20/2023), Disp: 60 capsule, Rfl: 5   mirtazapine  (REMERON ) 30 MG tablet, Take 1 tablet (30 mg total) by mouth at bedtime., Disp: 90 tablet, Rfl: 0   nitroGLYCERIN  (NITROSTAT ) 0.4 MG SL tablet, Place 1 tablet (0.4 mg total) under the tongue every 5 (five) minutes as needed., Disp: 25 tablet, Rfl: 2   Objective:     BP 132/78 (BP Location: Right Arm, Patient Position: Sitting, Cuff Size: Normal)   Pulse 78   Temp (!) 96.9 F (36.1 C) (Temporal)   Ht 5' 8 (1.727 m)   Wt 215 lb 3.2 oz (97.6 kg)   SpO2 97%   BMI 32.72 kg/m  BP Readings from Last 3 Encounters:  09/28/23 132/78  09/20/23 136/83  06/29/23 (!) 140/80   Wt Readings from Last 3 Encounters:  09/28/23 215 lb 3.2 oz (97.6 kg)  06/29/23 223 lb 6.4 oz (101.3 kg)  10/11/22 215 lb 6.4 oz (97.7 kg)      Physical Exam Constitutional:  General: He is not in acute distress.    Appearance: Normal appearance. He is not ill-appearing, toxic-appearing or diaphoretic.  HENT:     Head: Normocephalic and atraumatic.     Right Ear: External ear normal.     Left Ear: External ear normal.  Eyes:     General: No scleral icterus.       Right eye: No discharge.        Left eye: No discharge.     Extraocular Movements: Extraocular movements intact.     Conjunctiva/sclera: Conjunctivae normal.  Pulmonary:     Effort: Pulmonary effort is normal. No respiratory distress.  Skin:    General: Skin is warm and dry.  Neurological:     Mental Status: He is alert and oriented to person, place, and time.  Psychiatric:        Mood and Affect: Mood normal.        Behavior: Behavior normal.       No results found for any visits on 09/28/23.    The 10-year ASCVD risk score (Arnett DK, et al., 2019) is: 19%    Assessment & Plan:   Depression with anxiety -     Mirtazapine ; Take 1 tablet (30 mg total) by mouth at bedtime.  Dispense: 90 tablet; Refill: 0  Hepatic steatosis -     Hepatic function panel -     CBC  Elevated glucose -     Basic metabolic panel with GFR -     Hemoglobin A1c -     Hemoglobin A1c  Elevated LFTs -     Hepatic function panel -     CBC -     Hepatitis C antibody -     Hepatitis B surface antibody,quantitative  Screening for prostate cancer -     PSA  Essential hypertension -     Basic metabolic panel with GFR -     Urinalysis, Routine w reflex microscopic  B12 deficiency -     Vitamin B12    Return in about 3 months (around 12/28/2023), or Please stop smoking entirely and take your medications as directed..  Have increased mirtazapine  to 15 mg nightly.  Elsie Sim Lent, MD

## 2023-09-29 ENCOUNTER — Ambulatory Visit (INDEPENDENT_AMBULATORY_CARE_PROVIDER_SITE_OTHER): Payer: Medicare HMO

## 2023-09-29 ENCOUNTER — Ambulatory Visit: Payer: Self-pay | Admitting: Family Medicine

## 2023-09-29 DIAGNOSIS — Z0000 Encounter for general adult medical examination without abnormal findings: Secondary | ICD-10-CM | POA: Diagnosis not present

## 2023-09-29 LAB — URINALYSIS, ROUTINE W REFLEX MICROSCOPIC
Bilirubin Urine: NEGATIVE
Hgb urine dipstick: NEGATIVE
Ketones, ur: NEGATIVE
Leukocytes,Ua: NEGATIVE
Nitrite: NEGATIVE
Specific Gravity, Urine: 1.01 (ref 1.000–1.030)
Total Protein, Urine: NEGATIVE
Urine Glucose: 250 — AB
Urobilinogen, UA: 0.2 (ref 0.0–1.0)
pH: 7 (ref 5.0–8.0)

## 2023-09-29 LAB — CBC
HCT: 43.2 % (ref 39.0–52.0)
Hemoglobin: 14.9 g/dL (ref 13.0–17.0)
MCHC: 34.5 g/dL (ref 30.0–36.0)
MCV: 95.1 fl (ref 78.0–100.0)
Platelets: 175 K/uL (ref 150.0–400.0)
RBC: 4.54 Mil/uL (ref 4.22–5.81)
RDW: 13.3 % (ref 11.5–15.5)
WBC: 7.3 K/uL (ref 4.0–10.5)

## 2023-09-29 LAB — BASIC METABOLIC PANEL WITH GFR
BUN: 11 mg/dL (ref 6–23)
CO2: 27 meq/L (ref 19–32)
Calcium: 9.5 mg/dL (ref 8.4–10.5)
Chloride: 100 meq/L (ref 96–112)
Creatinine, Ser: 0.85 mg/dL (ref 0.40–1.50)
GFR: 89.58 mL/min (ref >60.00)
Glucose, Bld: 169 mg/dL — ABNORMAL HIGH (ref 70–99)
Potassium: 4.4 meq/L (ref 3.5–5.1)
Sodium: 135 meq/L (ref 135–145)

## 2023-09-29 LAB — PSA: PSA: 0.86 ng/mL (ref 0.10–4.00)

## 2023-09-29 LAB — HEPATIC FUNCTION PANEL
ALT: 39 U/L (ref 0–53)
AST: 29 U/L (ref 0–37)
Albumin: 4.4 g/dL (ref 3.5–5.2)
Alkaline Phosphatase: 71 U/L (ref 39–117)
Bilirubin, Direct: 0.1 mg/dL (ref 0.0–0.3)
Total Bilirubin: 0.5 mg/dL (ref 0.2–1.2)
Total Protein: 7 g/dL (ref 6.0–8.3)

## 2023-09-29 LAB — HEMOGLOBIN A1C: Hgb A1c MFr Bld: 8.6 % — ABNORMAL HIGH (ref 4.6–6.5)

## 2023-09-29 LAB — VITAMIN B12: Vitamin B-12: 348 pg/mL (ref 211–911)

## 2023-09-29 LAB — HEPATITIS B SURFACE ANTIBODY, QUANTITATIVE: Hep B S AB Quant (Post): <5 m[IU]/mL — ABNORMAL LOW (ref >=10)

## 2023-09-29 LAB — HEPATITIS C ANTIBODY: Hepatitis C Ab: NONREACTIVE

## 2023-09-29 MED ORDER — METFORMIN HCL ER 500 MG PO TB24: 500.0000 mg | ORAL_TABLET | Freq: Every evening | ORAL | 1 refills | Status: AC

## 2023-09-29 NOTE — Patient Instructions (Signed)
 Wayne Wheeler,  Thank you for taking the time for your Medicare Wellness Visit. I appreciate your continued commitment to your health goals. Please review the care plan we discussed, and feel free to reach out if I can assist you further.  Medicare recommends these wellness visits once per year to help you and your care team stay ahead of potential health issues. These visits are designed to focus on prevention, allowing your provider to concentrate on managing your acute and chronic conditions during your regular appointments.  Please note that Annual Wellness Visits do not include a physical exam. Some assessments may be limited, especially if the visit was conducted virtually. If needed, we may recommend a separate in-person follow-up with your provider.  Ongoing Care Seeing your primary care provider every 3 to 6 months helps us  monitor your health and provide consistent, personalized care.   Referrals If a referral was made during today's visit and you haven't received any updates within two weeks, please contact the referred provider directly to check on the status.  Recommended Screenings:  Health Maintenance  Topic Date Due   Flu Shot  04/09/2024*   Medicare Annual Wellness Visit  09/28/2024   DTaP/Tdap/Td vaccine (3 - Td or Tdap) 03/03/2031   Colon Cancer Screening  11/04/2032   Pneumococcal Vaccine for age over 77  Completed   Hepatitis C Screening  Completed   Zoster (Shingles) Vaccine  Completed   HPV Vaccine  Aged Out   Meningitis B Vaccine  Aged Out   COVID-19 Vaccine  Discontinued  *Topic was postponed. The date shown is not the original due date.       09/29/2023   10:55 AM  Advanced Directives  Does Patient Have a Medical Advance Directive? No  Would patient like information on creating a medical advance directive? No - Patient declined   Advance Care Planning is important because it: Ensures you receive medical care that aligns with your values, goals, and  preferences. Provides guidance to your family and loved ones, reducing the emotional burden of decision-making during critical moments.  Vision: Annual vision screenings are recommended for early detection of glaucoma, cataracts, and diabetic retinopathy. These exams can also reveal signs of chronic conditions such as diabetes and high blood pressure.  Dental: Annual dental screenings help detect early signs of oral cancer, gum disease, and other conditions linked to overall health, including heart disease and diabetes.  Please see the attached documents for additional preventive care recommendations.

## 2023-09-29 NOTE — Addendum Note (Signed)
 Addended by: BERNETA FALLOW A on: 09/29/2023 01:07 PM   Modules accepted: Orders

## 2023-09-29 NOTE — Progress Notes (Signed)
 Subjective:   Wayne Wheeler is a 68 y.o. who presents for a Medicare Wellness preventive visit.  As a reminder, Annual Wellness Visits don't include a physical exam, and some assessments may be limited, especially if this visit is performed virtually. We may recommend an in-person follow-up visit with your provider if needed.  Visit Complete: Virtual I connected with  Wayne Wheeler on 09/29/23 by a audio enabled telemedicine application and verified that I am speaking with the correct person using two identifiers.  Patient Location: Home  Provider Location: Office/Clinic  I discussed the limitations of evaluation and management by telemedicine. The patient expressed understanding and agreed to proceed.  Vital Signs: Because this visit was a virtual/telehealth visit, some criteria may be missing or patient reported. Any vitals not documented were not able to be obtained and vitals that have been documented are patient reported.  VideoError- Librarian, academic were attempted between this provider and patient, however failed, due to patient having technical difficulties OR patient did not have access to video capability.  We continued and completed visit with audio only.   Persons Participating in Visit: Patient.  AWV Questionnaire: No: Patient Medicare AWV questionnaire was not completed prior to this visit.  Cardiac Risk Factors include: advanced age (>37men, >87 women);hypertension;male gender     Objective:    Today's Vitals   There is no height or weight on file to calculate BMI.     09/29/2023   10:55 AM 09/26/2022   11:38 AM 09/07/2022    8:44 AM 08/24/2022    3:04 PM 09/14/2021    2:40 PM  Advanced Directives  Does Patient Have a Medical Advance Directive? No No No No No  Would patient like information on creating a medical advance directive? No - Patient declined  Yes (MAU/Ambulatory/Procedural Areas - Information given)  No - Patient declined     Current Medications (verified) Outpatient Encounter Medications as of 09/29/2023  Medication Sig   aspirin  EC 81 MG tablet Take 81 mg by mouth daily. Swallow whole.   clopidogrel  (PLAVIX ) 75 MG tablet Take 1 tablet by mouth once daily with breakfast   cyanocobalamin 1000 MCG tablet Take 1,000 mcg by mouth daily. (Patient taking differently: Take 1,000 mcg by mouth daily.)   ezetimibe  (ZETIA ) 10 MG tablet Take 1 tablet by mouth once daily   hydrochlorothiazide  (HYDRODIURIL ) 25 MG tablet Take 1 tablet by mouth once daily   lisinopril  (ZESTRIL ) 40 MG tablet TAKE 1 TABLET BY MOUTH ONCE DAILY *NEEDS APPOINTMENT FOR FURTHER REFILLS*   mirtazapine  (REMERON ) 30 MG tablet Take 1 tablet (30 mg total) by mouth at bedtime.   nitroGLYCERIN  (NITROSTAT ) 0.4 MG SL tablet Place 1 tablet (0.4 mg total) under the tongue every 5 (five) minutes as needed.   pantoprazole  (PROTONIX ) 40 MG tablet Take 1 tablet by mouth once daily   simvastatin  (ZOCOR ) 40 MG tablet TAKE 1 TABLET BY MOUTH AT BEDTIME   gabapentin  (NEURONTIN ) 300 MG capsule Take 1 capsule by mouth twice daily (Patient not taking: Reported on 09/29/2023)   No facility-administered encounter medications on file as of 09/29/2023.    Allergies (verified) Prednisone   History: Past Medical History:  Diagnosis Date   Depression    Dyslipidemia    Hypertension    Resting tremor    Past Surgical History:  Procedure Laterality Date   CORONARY STENT INTERVENTION N/A 09/07/2022   Procedure: CORONARY STENT INTERVENTION;  Surgeon: Dann Candyce RAMAN, MD;  Location[facility]  INVASIVE CV LAB;  Service: Cardiovascular;  Laterality: N/A;   LEFT HEART CATH AND CORONARY ANGIOGRAPHY N/A 09/07/2022   Procedure: LEFT HEART CATH AND CORONARY ANGIOGRAPHY;  Surgeon: Dann Candyce RAMAN, MD;  Location: [facility] INVASIVE CV LAB;  Service: Cardiovascular;  Laterality: N/A;   SHOULDER SURGERY Right 06/10/2009   Family History  Problem Relation Age of Onset   Depression  Mother    Heart disease Father    Social History   Socioeconomic History   Marital status: Married    Spouse name: Not on file   Number of children: Not on file   Years of education: Not on file   Highest education level: Not on file  Occupational History   Not on file  Tobacco Use   Smoking status: Former    Current packs/day: 0.00    Types: Cigarettes    Quit date: 2025    Years since quitting: 0.7   Smokeless tobacco: Never  Vaping Use   Vaping status: Never Used  Substance and Sexual Activity   Alcohol use: Yes    Alcohol/week: 1.0 standard drink of alcohol    Types: 1 Cans of beer per week    Comment: social   Drug use: Never   Sexual activity: Not on file  Other Topics Concern   Not on file  Social History Narrative   Are you right handed or left handed? 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 Yes.  Has a wife who stays with him at times.     Who lives with you? Cat and  2 dog   What type of home do you live in: 1 story or 2 story? two    Caffeine  2 cups daily   Social Drivers of Health   Financial Resource Strain: Low Risk  (09/29/2023)   Overall Financial Resource Strain (CARDIA)    Difficulty of Paying Living Expenses: Not hard at all  Food Insecurity: No Food Insecurity (09/29/2023)   Hunger Vital Sign    Worried About Running Out of Food in the Last Year: Never true    Ran Out of Food in the Last Year: Never true  Transportation Needs: No Transportation Needs (09/29/2023)   PRAPARE - Administrator, Civil Service (Medical): No    Lack of Transportation (Non-Medical): No  Physical Activity: Inactive (09/29/2023)   Exercise Vital Sign    Days of Exercise per Week: 0 days    Minutes of Exercise per Session: 0 min  Stress: No Stress Concern Present (09/29/2023)   [employer] of Occupational Health - Occupational Stress Questionnaire    Feeling of Stress: Not at all  Social Connections:  Moderately Isolated (09/29/2023)   Social Connection and Isolation Panel    Frequency of Communication with Friends and Family: More than three times a week    Frequency of Social Gatherings with Friends and Family: Twice a week    Attends Religious Services: Never    Database administrator or Organizations: No    Attends Engineer, structural: Never    Marital Status: Married    Tobacco Counseling Counseling given: Not Answered    Clinical Intake:  Pre-visit preparation completed: Yes  Pain : No/denies pain     Nutritional Risks: None Diabetes: No  Lab Results  Component Value Date   HGBA1C 5.9 (H) 05/15/2020   HGBA1C 6.1 09/25/2019     How often  do you need to have someone help you when you read instructions, pamphlets, or other written materials from your doctor or pharmacy?: 1 - Never  Interpreter Needed?: No  Information entered by :: NAllen LPN   Activities of Daily Living     09/29/2023   10:47 AM  In your present state of health, do you have any difficulty performing the following activities:  Hearing? 1  Comment no hearing aids  Vision? 0  Comment has cataracts  Difficulty concentrating or making decisions? 1  Comment memory due to medication, has since stopped  Walking or climbing stairs? 0  Dressing or bathing? 0  Doing errands, shopping? 0  Preparing Food and eating ? N  Using the Toilet? N  In the past six months, have you accidently leaked urine? N  Do you have problems with loss of bowel control? N  Managing your Medications? N  Managing your Finances? N  Housekeeping or managing your Housekeeping? N    Patient Care Team: Berneta Elsie Sayre, MD as PCP - General (Family Medicine) Lavona Agent, MD as PCP - Cardiology (Cardiology)  I have updated your Care Teams any recent Medical Services you may have received from other providers in the past year.     Assessment:   This is a routine wellness examination for  Wayne Wheeler.  Hearing/Vision screen Hearing Screening - Comments:: Slight decrease but no hearing aids Vision Screening - Comments:: No regular eye exams   Goals Addressed             This Visit's Progress    Patient Stated       09/29/2023, stay alive       Depression Screen     09/29/2023   10:57 AM 06/29/2023    3:24 PM 10/11/2022    1:06 PM 09/26/2022   11:41 AM 06/30/2022    2:01 PM 09/14/2021    2:38 PM 03/02/2021   11:02 AM  PHQ 2/9 Scores  PHQ - 2 Score 0 2 0 0 0 0 0  PHQ- 9 Score 4 9  0       Fall Risk     09/29/2023   10:56 AM 09/28/2023    2:36 PM 10/11/2022    1:05 PM 09/26/2022   11:39 AM 08/24/2022    3:03 PM  Fall Risk   Falls in the past year? 0 0 0 0 0  Number falls in past yr: 0 0  0 0  Injury with Fall? 0 0  0 0  Risk for fall due to : Medication side effect No Fall Risks No Fall Risks Medication side effect;Impaired balance/gait   Follow up Falls evaluation completed;Falls prevention discussed Falls evaluation completed  Falls prevention discussed;Falls evaluation completed Falls evaluation completed    MEDICARE RISK AT HOME:  Medicare Risk at Home Any stairs in or around the home?: Yes If so, are there any without handrails?: No Home free of loose throw rugs in walkways, pet beds, electrical cords, etc?: Yes Adequate lighting in your home to reduce risk of falls?: Yes Life alert?: No Use of a cane, walker or w/c?: No Grab bars in the bathroom?: No Shower chair or bench in shower?: No Elevated toilet seat or a handicapped toilet?: Yes  TIMED UP AND GO:  Was the test performed?  No  Cognitive Function: 6CIT completed        09/29/2023   10:58 AM 09/26/2022   11:43 AM 09/14/2021    2:41 PM  6CIT  Screen  What Year? 0 points 0 points 0 points  What month? 0 points 0 points 0 points  What time? 0 points 0 points 0 points  Count back from 20 0 points 0 points 0 points  Months in reverse 0 points 0 points 0 points  Repeat phrase 0 points 0 points 0  points  Total Score 0 points 0 points 0 points    Immunizations Immunization History  Administered Date(s) Administered   Influenza,inj,Quad PF,6+ Mos 09/25/2019   Influenza-Unspecified 10/19/2012   Moderna Sars-Covid-2 Vaccination 05/18/2019, 05/25/2019, 07/12/2019   PNEUMOCOCCAL CONJUGATE-20 03/02/2021   Pfizer(Comirnaty)Fall Seasonal Vaccine 12 years and older 03/29/2022   Tdap 02/01/2010, 03/02/2021   Zoster Recombinant(Shingrix) 03/15/2021, 03/29/2022    Screening Tests Health Maintenance  Topic Date Due   Influenza Vaccine  04/09/2024 (Originally 08/11/2023)   Medicare Annual Wellness (AWV)  09/28/2024   DTaP/Tdap/Td (3 - Td or Tdap) 03/03/2031   Colonoscopy  11/04/2032   Pneumococcal Vaccine: 50+ Years  Completed   Hepatitis C Screening  Completed   Zoster Vaccines- Shingrix  Completed   HPV VACCINES  Aged Out   Meningococcal B Vaccine  Aged Out   COVID-19 Vaccine  Discontinued    Health Maintenance Items Addressed: Up to date  Additional Screening:  Vision Screening: Recommended annual ophthalmology exams for early detection of glaucoma and other disorders of the eye. Is the patient up to date with their annual eye exam?  No  Who is the provider or what is the name of the office in which the patient attends annual eye exams? none  Dental Screening: Recommended annual dental exams for proper oral hygiene  Community Resource Referral / Chronic Care Management: CRR required this visit?  No   CCM required this visit?  No   Plan:    I have personally reviewed and noted the following in the patient's chart:   Medical and social history Use of alcohol, tobacco or illicit drugs  Current medications and supplements including opioid prescriptions. Patient is not currently taking opioid prescriptions. Functional ability and status Nutritional status Physical activity Advanced directives List of other physicians Hospitalizations, surgeries, and ER visits in  previous 12 months Vitals Screenings to include cognitive, depression, and falls Referrals and appointments  In addition, I have reviewed and discussed with patient certain preventive protocols, quality metrics, and best practice recommendations. A written personalized care plan for preventive services as well as general preventive health recommendations were provided to patient.   Ardella FORBES Dawn, LPN   [id number]   After Visit Summary: (Pick Up) Due to this being a telephonic visit, with patients personalized plan was offered to patient and patient has requested to Pick up at office.  Notes: Nothing significant to report at this time.

## 2023-10-08 ENCOUNTER — Other Ambulatory Visit: Payer: Self-pay | Admitting: Family Medicine

## 2023-10-08 DIAGNOSIS — I10 Essential (primary) hypertension: Secondary | ICD-10-CM

## 2023-11-02 ENCOUNTER — Other Ambulatory Visit: Payer: Self-pay | Admitting: Cardiology

## 2023-11-02 DIAGNOSIS — I2510 Atherosclerotic heart disease of native coronary artery without angina pectoris: Secondary | ICD-10-CM

## 2023-11-02 DIAGNOSIS — E782 Mixed hyperlipidemia: Secondary | ICD-10-CM

## 2023-11-06 ENCOUNTER — Other Ambulatory Visit: Payer: Self-pay | Admitting: Family Medicine

## 2023-11-06 DIAGNOSIS — E7800 Pure hypercholesterolemia, unspecified: Secondary | ICD-10-CM

## 2023-11-18 ENCOUNTER — Other Ambulatory Visit: Payer: Self-pay | Admitting: Cardiology

## 2023-11-18 DIAGNOSIS — E782 Mixed hyperlipidemia: Secondary | ICD-10-CM

## 2023-11-18 DIAGNOSIS — I2510 Atherosclerotic heart disease of native coronary artery without angina pectoris: Secondary | ICD-10-CM

## 2023-11-21 ENCOUNTER — Telehealth: Payer: Self-pay | Admitting: Cardiology

## 2023-11-21 DIAGNOSIS — I2510 Atherosclerotic heart disease of native coronary artery without angina pectoris: Secondary | ICD-10-CM

## 2023-11-21 DIAGNOSIS — E782 Mixed hyperlipidemia: Secondary | ICD-10-CM

## 2023-11-21 MED ORDER — EZETIMIBE 10 MG PO TABS: 10.0000 mg | ORAL_TABLET | Freq: Every day | ORAL | 0 refills | Status: DC

## 2023-11-21 MED ORDER — CLOPIDOGREL BISULFATE 75 MG PO TABS: 75.0000 mg | ORAL_TABLET | Freq: Every day | ORAL | 0 refills | Status: DC

## 2023-11-21 NOTE — Telephone Encounter (Signed)
 Pt scheduled to see Katlyn West, NP, 12/28/23.  #37 day supply refills sent in.

## 2023-11-21 NOTE — Telephone Encounter (Signed)
*  STAT* If patient is at the pharmacy, call can be transferred to refill team.   1. Which medications need to be refilled? (please list name of each medication and dose if known)  clopidogrel  (PLAVIX ) 75 MG tablet  ezetimibe  (ZETIA ) 10 MG tablet    2. Would you like to learn more about the convenience, safety, & potential cost savings by using the [facility]?      3. Are you open to using the [facility] (Type [facility].  ).   4. Which pharmacy/location (including street and city if local pharmacy) is medication to be sent to?  [id number] - [place], [place] - [address]     5. Do they need a 30 day or 90 day supply? 30 day

## 2023-12-18 ENCOUNTER — Other Ambulatory Visit: Payer: Self-pay | Admitting: Family Medicine

## 2023-12-18 DIAGNOSIS — I10 Essential (primary) hypertension: Secondary | ICD-10-CM

## 2023-12-18 DIAGNOSIS — K219 Gastro-esophageal reflux disease without esophagitis: Secondary | ICD-10-CM

## 2023-12-21 NOTE — Progress Notes (Signed)
 Wayne Wheeler                                          MRN: [id number]   12/21/2023   The [facility]I Quality Team Specialist reviewed this patient medical record for the purposes of chart review for care gap closure. The following were reviewed: chart review for care gap closure-kidney health evaluation for diabetes:eGFR  and uACR.    VBCI Quality Team

## 2023-12-24 NOTE — Progress Notes (Unsigned)
 Cardiology Office Note    Date:  12/28/2023  ID:  Wayne Wheeler, DOB March 06, 1955, MRN [id number] PCP:  Berneta Elsie Sayre, MD  Cardiologist:  Lynwood Schilling, MD  Electrophysiologist:  None   Chief Complaint: Follow up for CAD   History of Present Illness: .   Wayne Wheeler is a 68 y.o. male with visit-pertinent history of coronary artery disease s/p PCI with DES x 2 to mid LAD on 09/07/2022, hyperlipidemia, hypertension, prediabetes and tobacco abuse.  Wayne Wheeler is followed by Dr. Schilling.  Wayne Wheeler presents today for follow-up regarding PCI.   Wayne Wheeler was first evaluated by Dr. Schilling on 05/25/2022 for chest pain.  Coronary CTA showed a coronary calcium  score of 921, 71st percentile for age and sex, and severe CAD in the mid LAD and first diagonal with 70 to 99% stenosis.  FFR was suggestive of a total occlusion of the mid LAD.  Wayne Wheeler underwent cardiac catheterization on 09/07/2022 which showed tandem 90% and then 70% stenosis of the mid LAD but no other disease.  Wayne Wheeler underwent successful PCI with DES x 2 to the LAD.    Patient was last in clinic on 09/19/2022 for follow-up.  Wayne Wheeler remained stable from a cardiac standpoint.  Wayne Wheeler been working to stop smoking.  79-month follow-up was recommended however not completed.  Today Wayne Wheeler presents for overdue follow-up.  Wayne Wheeler reports that Wayne Wheeler has been doing well overall.  Wayne Wheeler denies any chest pain, shortness of breath, lower extremity edema, orthopnea or PND.  Wayne Wheeler denies any palpitations, presyncope or syncope.  Patient reports that Wayne Wheeler presented today in order to receive refills of his medications. ROS: .   Today Wayne Wheeler denies chest pain, shortness of breath, lower extremity edema, fatigue, palpitations, melena, hematuria, hemoptysis, diaphoresis, weakness, presyncope, syncope, orthopnea, and PND.  All other systems are reviewed and otherwise negative. Studies Reviewed: SABRA   EKG:  EKG is ordered today, personally reviewed, demonstrating  EKG Interpretation Date/Time:  Thursday  December 28 2023 14:51:45 EST Ventricular Rate:  76 PR Interval:  148 QRS Duration:  92 QT Interval:  360 QTC Calculation: 405 R Axis:   20  Text Interpretation: Sinus rhythm with Premature atrial complexes When compared with ECG of 19-Sep-2022 10:57, Nonspecific T wave abnormality no longer evident in Lateral leads QT has shortened Confirmed by Yoniel Arkwright [phone]) on 12/28/2023 4:19:35 PM   CV Studies: Cardiac studies reviewed are outlined and summarized above. Otherwise please see EMR for full report. Cardiac Studies & Procedures   ______________________________________________________________________________________________ CARDIAC CATHETERIZATION  CARDIAC CATHETERIZATION 09/07/2022  Conclusion   Mid LAD-1 lesion is 90% stenosed. Mid LAD-2 lesion is 70% stenosed. sequential.   A drug-eluting stent was successfully placed using a SYNERGY XD 3.0X38 postdilated to 3.25 mm.   A drug-eluting stent was successfully placed using a SYNERGY XD 2.75X8, overlapping. proximal stent.   Post intervention, there is a 0% residual stenosis.   The left ventricular systolic function is normal.   LV end diastolic pressure is normal.   The left ventricular ejection fraction is 55-65% by visual estimate.   There is no aortic valve stenosis.   There is no mitral valve regurgitation.   In the absence of any other complications or medical issues, we expect the patient to be ready for discharge from an interventional cardiology perspective on 09/07/2022.   Recommend uninterrupted dual antiplatelet therapy with Aspirin  81mg  daily and Clopidogrel  75mg  daily for a minimum of 6 months (stable ischemic heart disease-Class I recommendation).  Successful PCI of the long area of disease with overlapping LAD stents. Continue aggressive secondary prevention.  Stressed the importance of DAPT.  Plan for same day discharge.  Findings Coronary Findings Diagnostic  Dominance: Right  Left Anterior Descending Mid LAD-1  lesion is 90% stenosed. Mid LAD-2 lesion is 70% stenosed.  Intervention  Mid LAD-1 lesion Stent CATH LAUNCHER 6FR EBU3.5 guide catheter was inserted. Lesion crossed with guidewire using a WIRE RUNTHROUGH .[id number]. Pre-stent angioplasty was performed using a BALLN EMERGE MR 2.5X15. A drug-eluting stent was successfully placed using a SYNERGY XD 3.0X38. Stent strut is well apposed. Post-stent angioplasty was performed using a BALLN [place] EMERGE MR 3.25X20. Post-Intervention Lesion Assessment The intervention was successful. Pre-interventional TIMI flow is 3. Post-intervention TIMI flow is 3. No complications occurred at this lesion. There is a 0% residual stenosis post intervention.  Mid LAD-2 lesion Stent CATH LAUNCHER 6FR EBU3.5 guide catheter was inserted. Lesion crossed with guidewire using a WIRE RUNTHROUGH .[id number]. Pre-stent angioplasty was performed using a BALLN [place] EMERGE MR 3.25X20. A drug-eluting stent was successfully placed using a SYNERGY XD 2.75X8. Stent strut is well apposed. Post-stent angioplasty was performed using a BALLN [place] EMERGE MR 3.25X20. Post-Intervention Lesion Assessment The intervention was successful. Pre-interventional TIMI flow is 3. Post-intervention TIMI flow is 3. No complications occurred at this lesion. There is a 0% residual stenosis post intervention.          CT SCANS  CT CORONARY MORPH W/CTA COR W/SCORE 08/31/2022  Addendum 09/08/2022 12:04 PM ADDENDUM REPORT: 09/08/2022 12:01  EXAM: OVER-READ INTERPRETATION  CT CHEST  The following report is an over-read performed by radiologist Dr. Fonda Mom [facility] on 09/08/2022. This over-read does not include interpretation of cardiac or coronary anatomy or pathology. The coronary CTA interpretation by the cardiologist is attached.  COMPARISON:  None.  FINDINGS: Cardiovascular:  Findings discussed in the body of the report.  Mediastinum/Nodes: No suspicious adenopathy  identified. Imaged mediastinal structures are unremarkable.  Lungs/Pleura: Imaged lungs are clear. No pleural effusion or pneumothorax.  Upper Abdomen: No acute abnormality.  Musculoskeletal: No chest wall abnormality. There are thoracic degenerative changes.  IMPRESSION: No significant extracardiac incidental findings identified.   Electronically Signed By: Fonda Field M.D. On: 09/08/2022 12:01  Narrative HISTORY: Chest pain, nonspecific  EXAM: Cardiac/Coronary  CT  TECHNIQUE: The patient was scanned on a [employer].  PROTOCOL: A 120 kV prospective scan was triggered in the descending thoracic aorta at 111 HU's. Axial non-contrast 3 mm slices were carried out through the heart. The data set was analyzed on a dedicated work station and scored using the Agatston method. Gantry rotation speed was 250 msecs and collimation was .6 mm. Beta blockade and 0.8 mg of sl NTG was given. The 3D data set was reconstructed in 5% intervals of the 35-75 % of the R-R cycle. Systolic and diastolic phases were analyzed on a dedicated work station using MPR, MIP and VRT modes. The patient received contrast: 95mL OMNIPAQUE  IOHEXOL  350 MG/ML SOLN.  FINDINGS: Image quality: Good  Noise artifact is: Mild misregistration  Coronary calcium  score is 970, which places the patient in the 90th percentile for age and sex matched control.  Coronary arteries: Normal coronary origins.  Right dominance.  Right Coronary Artery: Minimal mixed plaque in the proximal RCA and proximal PDA, <25% stenosis. Mild mixed plaque in the mid and distal RCA and proximal PLA, 25-49% stenosis.  Left Main Coronary Artery: Mild mixed atherosclerotic plaque in the ostial  LM, 25-49% stenosis.  Left Anterior Descending Coronary Artery: Minimal mixed atherosclerotic plaque in the proxmial LAD, <25% stenosis. Severe mixed atherosclerotic plaque in the mid LAD, 70-99% stenosis. Severe calcified  plaque in the mid first diagonal artery, 70-99% stenosis.  Left Circumflex Artery: Minimal mixed atherosclerotic plaque in the proximal LCx, <25% stenosis.  Aorta: Normal size, 34 mm at the mid ascending aorta (level of the PA bifurcation) measured double oblique. Aortic atherosclerosis.  Aortic Valve: No calcifications.  Other findings:  Normal pulmonary vein drainage into the left atrium.  Normal left atrial appendage without thrombus.  Normal size of the pulmonary artery.  Please see separate report from [facility] for non-cardiac findings.  IMPRESSION: 1. Severe CAD in the mid LAD and first diagonal artery, 70-99% stenosis, CADRADS 4. CT FFR will be performed and reported separately.  2. Total plaque volume 921 mm3 which is 71st percentile for age- and sex-matched controls (calcified plaque 143 mm3; non-calcified plaque 778 mm3). TPV is extensive.  3. Coronary calcium  score is 970, which places the patient in the 90th percentile for age and sex matched control.  4. Normal coronary origins with right dominance.  RECOMMENDATIONS: CAD-RADS 4 Severe stenosis. (70-99% or > 50% left main). Cardiac catheterization or CT FFR is recommended. Consider symptom-guided anti-ischemic pharmacotherapy as well as risk factor modification per guideline directed care.  Electronically Signed: By: Soyla Merck M.D. On: 09/01/2022 14:02     ______________________________________________________________________________________________       Current Reported Medications:.    Active Medications[1]  Physical Exam:    VS:  BP 128/72   Pulse 76   Ht 5' 8 (1.727 m)   Wt 214 lb (97.1 kg)   SpO2 98%   BMI 32.54 kg/m    Wt Readings from Last 3 Encounters:  12/28/23 214 lb (97.1 kg)  09/28/23 215 lb 3.2 oz (97.6 kg)  06/29/23 223 lb 6.4 oz (101.3 kg)    GEN: Well nourished, well developed in no acute distress NECK: No JVD; No carotid bruits CARDIAC: RRR, no  murmurs, rubs, gallops RESPIRATORY:  Clear to auscultation without rales, wheezing or rhonchi  ABDOMEN: Soft, non-tender, non-distended EXTREMITIES:  No edema; No acute deformity     Asessement and Plan:.    CAD: Cardiac cath on 09/07/22 which showed tandem 90% then 70% stenoses of the mid LAD, no other disease. Wayne Wheeler had successful PCI with DES x2 to the LAD. Stable with no anginal symptoms. No indication for ischemic evaluation.  Heart healthy diet and regular cardiovascular exercise encouraged.  Reviewed ED precautions.  Patient prefers to continue aspirin  81 mg daily and Plavix  75 mg daily.  Refills provided.  Continue Zetia  10 mg daily, hydrochlorothiazide  25 mg daily, lisinopril  40 mg daily and simvastatin  40 mg daily.  Hypertension: Blood pressure today initially 146/82, on recheck 128/72.  Patient notes that Wayne Wheeler accident slightly went to the wrong office location and was concerned Wayne Wheeler would not make it in time.  Wayne Wheeler notes his blood pressure is typically well-controlled.  Encouraged patient to continue monitoring at home and notify the office if consistently elevated above 130/80.  Continue hydrochlorothiazide  25 mg daily and lisinopril  40 mg daily.  Hyperlipidemia: Direct LDL 71 on 06/29/2023.  Check fasting lipid profile and LFTs.  Patient reports that Wayne Wheeler would not consider switching to a different statin medication and would decline PCSK9 inhibitors at this time.  Discussed with patient would recommend LDL goal less than 55 given diabetes and history of CAD, patient verbalized  understanding and politely declined.  Continue Zetia  10 mg daily and simvastatin  40 mg daily.  Tobacco use: Patient reports that Wayne Wheeler continues to intermittently smoke cigarettes, is unsure of how much.  Complete cessation encouraged.   Disposition: F/u with Dr. Lavona in six months or sooner if needed.   Signed, Aleksis Jiggetts D Shasha Buchbinder, NP       [1]  Current Meds  Medication Sig   aspirin  EC 81 MG tablet Take 81 mg by mouth  daily. Swallow whole.   cyanocobalamin  1000 MCG tablet Take 1,000 mcg by mouth daily.   gabapentin  (NEURONTIN ) 300 MG capsule Take 1 capsule by mouth twice daily   hydrochlorothiazide  (HYDRODIURIL ) 25 MG tablet Take 1 tablet by mouth once daily   lisinopril  (ZESTRIL ) 40 MG tablet TAKE 1 TABLET BY MOUTH ONCE DAILY *NEEDS APPOINTMENT FOR FURTHER REFILLS*   metFORMIN  (GLUCOPHAGE -XR) 500 MG 24 hr tablet Take 1 tablet (500 mg total) by mouth at bedtime.   mirtazapine  (REMERON ) 30 MG tablet Take 1 tablet (30 mg total) by mouth at bedtime.   nitroGLYCERIN  (NITROSTAT ) 0.4 MG SL tablet Place 1 tablet (0.4 mg total) under the tongue every 5 (five) minutes as needed.   pantoprazole  (PROTONIX ) 40 MG tablet Take 1 tablet by mouth once daily   simvastatin  (ZOCOR ) 40 MG tablet TAKE 1 TABLET BY MOUTH AT BEDTIME   [DISCONTINUED] clopidogrel  (PLAVIX ) 75 MG tablet Take 1 tablet (75 mg total) by mouth daily with breakfast.   [DISCONTINUED] ezetimibe  (ZETIA ) 10 MG tablet Take 1 tablet (10 mg total) by mouth daily.

## 2023-12-25 ENCOUNTER — Other Ambulatory Visit: Payer: Self-pay | Admitting: Cardiology

## 2023-12-25 DIAGNOSIS — I2510 Atherosclerotic heart disease of native coronary artery without angina pectoris: Secondary | ICD-10-CM

## 2023-12-25 DIAGNOSIS — E782 Mixed hyperlipidemia: Secondary | ICD-10-CM

## 2023-12-28 ENCOUNTER — Ambulatory Visit: Admitting: Cardiology

## 2023-12-28 ENCOUNTER — Encounter: Payer: Self-pay | Admitting: Cardiology

## 2023-12-28 VITALS — BP 128/72 | HR 76 | Ht 68.0 in | Wt 214.0 lb

## 2023-12-28 DIAGNOSIS — I10 Essential (primary) hypertension: Secondary | ICD-10-CM | POA: Diagnosis not present

## 2023-12-28 DIAGNOSIS — I491 Atrial premature depolarization: Secondary | ICD-10-CM

## 2023-12-28 DIAGNOSIS — Z720 Tobacco use: Secondary | ICD-10-CM

## 2023-12-28 DIAGNOSIS — E782 Mixed hyperlipidemia: Secondary | ICD-10-CM

## 2023-12-28 DIAGNOSIS — I2510 Atherosclerotic heart disease of native coronary artery without angina pectoris: Secondary | ICD-10-CM | POA: Diagnosis not present

## 2023-12-28 MED ORDER — CLOPIDOGREL BISULFATE 75 MG PO TABS: 75.0000 mg | ORAL_TABLET | Freq: Every day | ORAL | 3 refills | Status: AC

## 2023-12-28 MED ORDER — EZETIMIBE 10 MG PO TABS: 10.0000 mg | ORAL_TABLET | Freq: Every day | ORAL | 3 refills | Status: AC

## 2023-12-28 NOTE — Patient Instructions (Signed)
 Medication Instructions:  Your physician recommends that you continue on your current medications as directed. Please refer to the Current Medication list given to you today.  *If you need a refill on your cardiac medications before your next appointment, please call your pharmacy*  Lab Work: Today- Fasting Lipids, LFTs If you have labs (blood work) drawn today and your tests are completely normal, you will receive your results only by: MyChart Message (if you have MyChart) OR A paper copy in the mail If you have any lab test that is abnormal or we need to change your treatment, we will call you to review the results.   Follow-Up: At [facility], you and your health needs are our priority.  As part of our continuing mission to provide you with exceptional heart care, our providers are all part of one team.  This team includes your primary Cardiologist (physician) and Advanced Practice Providers or APPs (Physician Assistants and Nurse Practitioners) who all work together to provide you with the care you need, when you need it.  Your next appointment:   6 month(s)  Provider:   Lynwood Schilling, MD

## 2023-12-29 ENCOUNTER — Ambulatory Visit: Payer: Self-pay | Admitting: Cardiology

## 2023-12-29 LAB — LIPID PANEL
Chol/HDL Ratio: 2.8 ratio (ref 0.0–5.0)
Cholesterol, Total: 115 mg/dL (ref 100–199)
HDL: 41 mg/dL
LDL Chol Calc (NIH): 53 mg/dL (ref 0–99)
Triglycerides: 112 mg/dL (ref 0–149)
VLDL Cholesterol Cal: 21 mg/dL (ref 5–40)

## 2023-12-29 LAB — HEPATIC FUNCTION PANEL
ALT: 36 IU/L (ref 0–44)
AST: 27 IU/L (ref 0–40)
Albumin: 4.8 g/dL (ref 3.9–4.9)
Alkaline Phosphatase: 77 IU/L (ref 47–123)
Bilirubin Total: 0.4 mg/dL (ref 0.0–1.2)
Bilirubin, Direct: 0.15 mg/dL (ref 0.00–0.40)
Total Protein: 7.3 g/dL (ref 6.0–8.5)

## 2023-12-29 NOTE — Telephone Encounter (Signed)
Lvm of normal results and clinic number if any questions

## 2023-12-29 NOTE — Telephone Encounter (Signed)
-----   Message from Katlyn West, NP sent at 12/29/2023  8:36 AM EST ----- Please let Wayne Wheeler know that his lipid profile shows that his LDL or bad cholesterol is very well-controlled, his liver function is normal.  Good results!  Continue current medications.

## 2024-01-01 ENCOUNTER — Other Ambulatory Visit: Payer: Self-pay | Admitting: Family Medicine

## 2024-01-01 DIAGNOSIS — I10 Essential (primary) hypertension: Secondary | ICD-10-CM

## 2024-01-17 ENCOUNTER — Ambulatory Visit: Admitting: Family Medicine

## 2024-01-17 NOTE — Progress Notes (Incomplete)
 " [facility] [address] [place] [address] Dept: [phone] Dept Fax: [phone]  Office Visit  Subjective:    Patient ID: Wayne Wheeler, male    DOB: [birth year], 69 y.o..   MRN: [id number]  No chief complaint on file.  History of Present Illness:  Patient is in today for ***  Past Medical History: Patient Active Problem List   Diagnosis Date Noted   Change in voice 06/29/2023   Gastroesophageal reflux disease 10/11/2022   Carpal tunnel syndrome of right wrist 10/11/2022   Chest pain 09/07/2022   Chronic obstructive pulmonary disease (HCC) 06/30/2022   Palpitations 06/30/2022   Screening for prostate cancer 06/30/2022   Colon cancer screening 06/30/2022   Hepatic steatosis 03/18/2021   Right shoulder pain 03/02/2021   Right upper quadrant abdominal pain 03/02/2021   Need for vaccination against Streptococcus pneumoniae 03/02/2021   Need for Tdap vaccination 03/02/2021   Proximal muscle weakness 05/15/2020   Prediabetes 03/06/2020   Elevated LDL cholesterol level 09/26/2019   Essential hypertension 09/25/2019   Tobacco use 05/13/2019   Decreased pedal pulses 05/13/2019   Depression with anxiety 05/13/2019   History of diabetes mellitus, type II 05/13/2019   Healthcare maintenance 05/13/2019   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   Tremor observed on examination 02/26/2015   Cervical radiculopathy 02/26/2015   IFG (impaired fasting glucose) 02/26/2015   Hyperlipidemia, unspecified 02/26/2015   Past Surgical History:  Procedure Laterality Date   CORONARY STENT INTERVENTION N/A 09/07/2022   Procedure: CORONARY STENT INTERVENTION;  Surgeon: Dann Candyce RAMAN, MD;  Location: [facility] INVASIVE CV LAB;  Service: Cardiovascular;  Laterality: N/A;   LEFT HEART CATH AND CORONARY ANGIOGRAPHY N/A 09/07/2022   Procedure: LEFT HEART CATH AND CORONARY ANGIOGRAPHY;  Surgeon: Dann Candyce RAMAN, MD;  Location: [facility]  INVASIVE CV LAB;  Service: Cardiovascular;  Laterality: N/A;   SHOULDER SURGERY Right 06/10/2009   Family History  Problem Relation Age of Onset   Depression Mother    Heart disease Father    Outpatient Medications Prior to Visit  Medication Sig Dispense Refill   aspirin  EC 81 MG tablet Take 81 mg by mouth daily. Swallow whole.     clopidogrel  (PLAVIX ) 75 MG tablet Take 1 tablet (75 mg total) by mouth daily with breakfast. 90 tablet 3   cyanocobalamin  1000 MCG tablet Take 1,000 mcg by mouth daily.     ezetimibe  (ZETIA ) 10 MG tablet Take 1 tablet (10 mg total) by mouth daily. 90 tablet 3   gabapentin  (NEURONTIN ) 300 MG capsule Take 1 capsule by mouth twice daily 60 capsule 5   hydrochlorothiazide  (HYDRODIURIL ) 25 MG tablet Take 1 tablet by mouth once daily 90 tablet 0   lisinopril  (ZESTRIL ) 40 MG tablet TAKE 1 TABLET BY MOUTH ONCE DAILY *NEEDS APPOINTMENT FOR FURTHER REFILLS* 30 tablet 0   metFORMIN  (GLUCOPHAGE -XR) 500 MG 24 hr tablet Take 1 tablet (500 mg total) by mouth at bedtime. 90 tablet 1   mirtazapine  (REMERON ) 30 MG tablet Take 1 tablet (30 mg total) by mouth at bedtime. 90 tablet 0   nitroGLYCERIN  (NITROSTAT ) 0.4 MG SL tablet Place 1 tablet (0.4 mg total) under the tongue every 5 (five) minutes as needed. 25 tablet 2   pantoprazole  (PROTONIX ) 40 MG tablet Take 1 tablet by mouth once daily 90 tablet 0   simvastatin  (ZOCOR ) 40 MG tablet TAKE 1 TABLET BY MOUTH AT BEDTIME 90 tablet 0   No facility-administered medications prior to visit.  Allergies[1]   Objective:   There were no vitals filed for this visit. There is no height or weight on file to calculate BMI.   General: Well developed, well nourished. No acute distress. HEENT: Normocephalic, non-traumatic. PERRL, EOMI. Conjunctiva clear. External ears normal. EAC and TMs normal bilaterally. Nose    clear without congestion or rhinorrhea. Mucous membranes moist. Oropharynx clear. Good dentition. Neck: Supple. No  lymphadenopathy. No thyromegaly. Lungs: Clear to auscultation bilaterally. No wheezing, rales or rhonchi. CV: RRR without murmurs or rubs. Pulses 2+ bilaterally. Abdomen: Soft, non-tender. Bowel sounds positive, normal pitch and frequency. No hepatosplenomegaly. No rebound or guarding. Back: Straight. No CVA tenderness bilaterally. Extremities: Full ROM. No joint swelling or tenderness. No edema noted. Skin: Warm and dry. No rashes. Neuro: CN II-XII intact. Normal sensation and DTR bilaterally. Psych: Alert and oriented. Normal mood and affect.  Health Maintenance Due  Topic Date Due   Diabetic kidney evaluation - Urine ACR  Never done    Lab Results {Labs (Optional):29002}    Assessment & Plan:   Problem List Items Addressed This Visit   None   No follow-ups on file.    [name], MD  I,Emily Lagle,acting as a scribe for [name], MD.,have documented all relevant documentation on the behalf of [name], MD.  I, [name], MD, have reviewed all documentation for this visit. The documentation on 01/17/2024 for the exam, diagnosis, procedures, and orders are all accurate and complete.    [1]  Allergies Allergen Reactions   Prednisone Other (See Comments)    Dyspepsia   "

## 2024-01-25 ENCOUNTER — Ambulatory Visit: Admitting: Family Medicine

## 2024-01-25 ENCOUNTER — Ambulatory Visit: Payer: Self-pay | Admitting: Family Medicine

## 2024-01-25 ENCOUNTER — Encounter: Payer: Self-pay | Admitting: Family Medicine

## 2024-01-25 VITALS — BP 135/75 | HR 73 | Temp 98.1°F | Ht 68.0 in | Wt 213.0 lb

## 2024-01-25 DIAGNOSIS — E538 Deficiency of other specified B group vitamins: Secondary | ICD-10-CM | POA: Diagnosis not present

## 2024-01-25 DIAGNOSIS — E1165 Type 2 diabetes mellitus with hyperglycemia: Secondary | ICD-10-CM

## 2024-01-25 DIAGNOSIS — I10 Essential (primary) hypertension: Secondary | ICD-10-CM | POA: Diagnosis not present

## 2024-01-25 DIAGNOSIS — Z720 Tobacco use: Secondary | ICD-10-CM

## 2024-01-25 LAB — BASIC METABOLIC PANEL WITH GFR
BUN: 7 mg/dL (ref 6–23)
CO2: 29 meq/L (ref 19–32)
Calcium: 9.1 mg/dL (ref 8.4–10.5)
Chloride: 100 meq/L (ref 96–112)
Creatinine, Ser: 0.89 mg/dL (ref 0.40–1.50)
GFR: 88.14 mL/min
Glucose, Bld: 157 mg/dL — ABNORMAL HIGH (ref 70–99)
Potassium: 3.7 meq/L (ref 3.5–5.1)
Sodium: 136 meq/L (ref 135–145)

## 2024-01-25 LAB — HEMOGLOBIN A1C: Hgb A1c MFr Bld: 7 % — ABNORMAL HIGH (ref 4.6–6.5)

## 2024-01-25 LAB — VITAMIN B12: Vitamin B-12: 686 pg/mL (ref 211–911)

## 2024-01-25 NOTE — Telephone Encounter (Signed)
 Patient has been notified directly; all questions, if any, were answered. Patient voiced understanding.  Follow up appointment has been made

## 2024-01-25 NOTE — Progress Notes (Signed)
 "  Established Patient Office Visit   Subjective:  Patient ID: Wayne Wheeler, male    DOB: 01/13/1955  Age: 69 y.o. MRN: [id number]  Chief Complaint  Patient presents with   Medical Management of Chronic Issues    Four month follow-up     HPI Encounter Diagnoses  Name Primary?   Essential hypertension Yes   Uncontrolled type 2 diabetes mellitus with hyperglycemia (HCC)    Tobacco use    B12 deficiency    For follow-up of above.  Continues lisinopril  40 and HCTZ 25 mg for blood pressure.  Not regularly checking blood pressure.  When he his systolic is usually in the 130s.  No issues taking the metformin .  No diarrhea but stools have been a little loose.  Continues to smoke about a half a pack a day.  Would like to try the patches but was concerned about whether or not he could use them while taking the mirtazapine .   Review of Systems  Constitutional: Negative.   HENT: Negative.    Eyes:  Negative for blurred vision, discharge and redness.  Respiratory: Negative.    Cardiovascular: Negative.   Gastrointestinal:  Negative for abdominal pain.  Genitourinary: Negative.   Musculoskeletal: Negative.  Negative for myalgias.  Skin:  Negative for rash.  Neurological:  Negative for tingling, loss of consciousness and weakness.  Endo/Heme/Allergies:  Negative for polydipsia.    Current Medications[1]   Objective:     BP 135/75   Pulse 73   Temp 98.1 F (36.7 C)   Ht 5' 8 (1.727 m)   Wt 213 lb (96.6 kg)   SpO2 96%   BMI 32.39 kg/m  BP Readings from Last 3 Encounters:  01/25/24 135/75  12/28/23 128/72  09/28/23 132/78   Wt Readings from Last 3 Encounters:  01/25/24 213 lb (96.6 kg)  12/28/23 214 lb (97.1 kg)  09/28/23 215 lb 3.2 oz (97.6 kg)      Physical Exam Constitutional:      General: He is not in acute distress.    Appearance: Normal appearance. He is not ill-appearing, toxic-appearing or diaphoretic.  HENT:     Head: Normocephalic and atraumatic.     Right  Ear: External ear normal.     Left Ear: External ear normal.  Eyes:     General: No scleral icterus.       Right eye: No discharge.        Left eye: No discharge.     Extraocular Movements: Extraocular movements intact.     Conjunctiva/sclera: Conjunctivae normal.  Pulmonary:     Effort: Pulmonary effort is normal. No respiratory distress.  Skin:    General: Skin is warm and dry.  Neurological:     Mental Status: He is alert and oriented to person, place, and time.  Psychiatric:        Mood and Affect: Mood normal.        Behavior: Behavior normal.      No results found for any visits on 01/25/24.    The ASCVD Risk score (Arnett DK, et al., 2019) failed to calculate for the following reasons:   The valid total cholesterol range is 130 to 320 mg/dL    Assessment & Plan:   Essential hypertension -     Basic metabolic panel with GFR  Uncontrolled type 2 diabetes mellitus with hyperglycemia (HCC) -     Hemoglobin A1c -     Basic metabolic panel with GFR  Tobacco use  B12 deficiency -     Vitamin B12    Return in about 3 months (around 04/24/2024), or Please check and record blood pressures.  May need to increase metformin  dose.Wayne Wheeler to use the patches while taking mirtazapine .  Stressed the importance of not smoking while using the patches.  Discussed moderating intake of simple carbohydrates such as sweets and avoiding all sugary drinks.  Wayne Sim Lent, MD    [1]  Current Outpatient Medications:    aspirin  EC 81 MG tablet, Take 81 mg by mouth daily. Swallow whole., Disp: , Rfl:    clopidogrel  (PLAVIX ) 75 MG tablet, Take 1 tablet (75 mg total) by mouth daily with breakfast., Disp: 90 tablet, Rfl: 3   cyanocobalamin  1000 MCG tablet, Take 1,000 mcg by mouth daily., Disp: , Rfl:    ezetimibe  (ZETIA ) 10 MG tablet, Take 1 tablet (10 mg total) by mouth daily., Disp: 90 tablet, Rfl: 3   hydrochlorothiazide  (HYDRODIURIL ) 25 MG tablet, Take 1 tablet by mouth once  daily, Disp: 90 tablet, Rfl: 0   lisinopril  (ZESTRIL ) 40 MG tablet, TAKE 1 TABLET BY MOUTH ONCE DAILY *NEEDS APPOINTMENT FOR FURTHER REFILLS*, Disp: 30 tablet, Rfl: 0   metFORMIN  (GLUCOPHAGE -XR) 500 MG 24 hr tablet, Take 1 tablet (500 mg total) by mouth at bedtime., Disp: 90 tablet, Rfl: 1   mirtazapine  (REMERON ) 30 MG tablet, Take 1 tablet (30 mg total) by mouth at bedtime., Disp: 90 tablet, Rfl: 0   nitroGLYCERIN  (NITROSTAT ) 0.4 MG SL tablet, Place 1 tablet (0.4 mg total) under the tongue every 5 (five) minutes as needed., Disp: 25 tablet, Rfl: 2   pantoprazole  (PROTONIX ) 40 MG tablet, Take 1 tablet by mouth once daily, Disp: 90 tablet, Rfl: 0   simvastatin  (ZOCOR ) 40 MG tablet, TAKE 1 TABLET BY MOUTH AT BEDTIME, Disp: 90 tablet, Rfl: 0   gabapentin  (NEURONTIN ) 300 MG capsule, Take 1 capsule by mouth twice daily (Patient not taking: Reported on 01/25/2024), Disp: 60 capsule, Rfl: 5  "

## 2024-02-04 ENCOUNTER — Other Ambulatory Visit: Payer: Self-pay | Admitting: Family Medicine

## 2024-02-04 DIAGNOSIS — E7800 Pure hypercholesterolemia, unspecified: Secondary | ICD-10-CM

## 2024-02-09 ENCOUNTER — Other Ambulatory Visit: Payer: Self-pay | Admitting: Family Medicine

## 2024-02-09 DIAGNOSIS — I10 Essential (primary) hypertension: Secondary | ICD-10-CM

## 2024-04-25 ENCOUNTER — Ambulatory Visit: Admitting: Family Medicine
